# Patient Record
Sex: Female | Born: 1955
Health system: Southern US, Community
[De-identification: ages and names within clinical notes are randomized; demographics above are authoritative.]

## PROBLEM LIST (undated history)

## (undated) DIAGNOSIS — E46 Unspecified protein-calorie malnutrition: Secondary | ICD-10-CM

## (undated) DIAGNOSIS — I10 Essential (primary) hypertension: Secondary | ICD-10-CM

## (undated) DIAGNOSIS — K5792 Diverticulitis of intestine, part unspecified, without perforation or abscess without bleeding: Secondary | ICD-10-CM

## (undated) DIAGNOSIS — J90 Pleural effusion, not elsewhere classified: Secondary | ICD-10-CM

## (undated) DIAGNOSIS — M25519 Pain in unspecified shoulder: Secondary | ICD-10-CM

## (undated) DIAGNOSIS — M810 Age-related osteoporosis without current pathological fracture: Secondary | ICD-10-CM

## (undated) DIAGNOSIS — G43009 Migraine without aura, not intractable, without status migrainosus: Secondary | ICD-10-CM

## (undated) DIAGNOSIS — N3010 Interstitial cystitis (chronic) without hematuria: Secondary | ICD-10-CM

## (undated) DIAGNOSIS — I471 Supraventricular tachycardia, unspecified: Secondary | ICD-10-CM

## (undated) DIAGNOSIS — Z973 Presence of spectacles and contact lenses: Secondary | ICD-10-CM

## (undated) DIAGNOSIS — D682 Hereditary deficiency of other clotting factors: Secondary | ICD-10-CM

## (undated) DIAGNOSIS — R0602 Shortness of breath: Secondary | ICD-10-CM

## (undated) DIAGNOSIS — R945 Abnormal results of liver function studies: Secondary | ICD-10-CM

## (undated) DIAGNOSIS — K219 Gastro-esophageal reflux disease without esophagitis: Secondary | ICD-10-CM

## (undated) DIAGNOSIS — D72829 Elevated white blood cell count, unspecified: Secondary | ICD-10-CM

## (undated) DIAGNOSIS — H538 Other visual disturbances: Secondary | ICD-10-CM

## (undated) DIAGNOSIS — J309 Allergic rhinitis, unspecified: Secondary | ICD-10-CM

## (undated) DIAGNOSIS — D219 Benign neoplasm of connective and other soft tissue, unspecified: Secondary | ICD-10-CM

## (undated) DIAGNOSIS — F32A Depression, unspecified: Secondary | ICD-10-CM

## (undated) DIAGNOSIS — E041 Nontoxic single thyroid nodule: Secondary | ICD-10-CM

## (undated) DIAGNOSIS — G43909 Migraine, unspecified, not intractable, without status migrainosus: Secondary | ICD-10-CM

## (undated) DIAGNOSIS — E785 Hyperlipidemia, unspecified: Secondary | ICD-10-CM

## (undated) DIAGNOSIS — E559 Vitamin D deficiency, unspecified: Secondary | ICD-10-CM

## (undated) DIAGNOSIS — N920 Excessive and frequent menstruation with regular cycle: Secondary | ICD-10-CM

## (undated) DIAGNOSIS — G459 Transient cerebral ischemic attack, unspecified: Secondary | ICD-10-CM

## (undated) DIAGNOSIS — R079 Chest pain, unspecified: Secondary | ICD-10-CM

## (undated) DIAGNOSIS — K589 Irritable bowel syndrome without diarrhea: Secondary | ICD-10-CM

## (undated) DIAGNOSIS — N3090 Cystitis, unspecified without hematuria: Secondary | ICD-10-CM

## (undated) HISTORY — DX: Chest pain, unspecified: R07.9

## (undated) HISTORY — PX: BREAST EXCISIONAL BIOPSY: SUR124

## (undated) HISTORY — DX: Abnormal results of liver function studies: R94.5

## (undated) HISTORY — DX: Hereditary deficiency of other clotting factors: D68.2

## (undated) HISTORY — DX: Age-related osteoporosis without current pathological fracture: M81.0

## (undated) HISTORY — DX: Gastro-esophageal reflux disease without esophagitis: K21.9

## (undated) HISTORY — DX: Transient cerebral ischemic attack, unspecified: G45.9

## (undated) HISTORY — PX: CHOLECYSTECTOMY: SHX55

## (undated) HISTORY — DX: Elevated white blood cell count, unspecified: D72.829

## (undated) HISTORY — DX: Nontoxic single thyroid nodule: E04.1

## (undated) HISTORY — DX: Allergic rhinitis, unspecified: J30.9

## (undated) HISTORY — PX: BREAST SURGERY: SHX581

## (undated) HISTORY — DX: Shortness of breath: R06.02

## (undated) HISTORY — DX: Diverticulitis of intestine, part unspecified, without perforation or abscess without bleeding: K57.92

## (undated) HISTORY — DX: Pleural effusion, not elsewhere classified: J90

## (undated) HISTORY — DX: Irritable bowel syndrome, unspecified: K58.9

## (undated) HISTORY — DX: Unspecified protein-calorie malnutrition: E46

## (undated) HISTORY — DX: Hyperlipidemia, unspecified: E78.5

## (undated) HISTORY — DX: Vitamin D deficiency, unspecified: E55.9

## (undated) HISTORY — DX: Excessive and frequent menstruation with regular cycle: N92.0

## (undated) HISTORY — DX: Migraine without aura, not intractable, without status migrainosus: G43.009

## (undated) HISTORY — DX: Benign neoplasm of connective and other soft tissue, unspecified: D21.9

## (undated) HISTORY — DX: Cystitis, unspecified without hematuria: N30.90

## (undated) HISTORY — DX: Pain in unspecified shoulder: M25.519

## (undated) HISTORY — PX: APPENDECTOMY: SHX54

## (undated) HISTORY — DX: Other visual disturbances: H53.8

## (undated) HISTORY — DX: Migraine, unspecified, not intractable, without status migrainosus: G43.909

## (undated) HISTORY — PX: TONSILLECTOMY: SUR1361

## (undated) HISTORY — DX: Supraventricular tachycardia, unspecified: I47.10

## (undated) HISTORY — DX: Interstitial cystitis (chronic) without hematuria: N30.10

## (undated) HISTORY — DX: Depression, unspecified: F32.A

---

## 2003-07-23 HISTORY — PX: VAGINAL HYSTERECTOMY: SUR661

## 2012-03-30 ENCOUNTER — Other Ambulatory Visit: Payer: Self-pay | Admitting: Obstetrics and Gynecology

## 2012-03-30 DIAGNOSIS — Z1231 Encounter for screening mammogram for malignant neoplasm of breast: Secondary | ICD-10-CM

## 2012-04-07 ENCOUNTER — Ambulatory Visit
Admission: RE | Admit: 2012-04-07 | Discharge: 2012-04-07 | Disposition: A | Discharge status: Home / Self Care | Payer: BC Managed Care – PPO | Source: Ambulatory Visit | Attending: Obstetrics and Gynecology | Admitting: Obstetrics and Gynecology

## 2012-04-07 DIAGNOSIS — Z1231 Encounter for screening mammogram for malignant neoplasm of breast: Secondary | ICD-10-CM

## 2013-05-10 ENCOUNTER — Other Ambulatory Visit: Payer: Self-pay | Admitting: Certified Nurse Midwife

## 2013-05-10 NOTE — Telephone Encounter (Signed)
Rx given for #30 w/ 0refills Estradiol 1mg   Annual Exam 06/01/13 D.Darcel Bayley

## 2013-05-31 ENCOUNTER — Encounter: Payer: Self-pay | Admitting: Certified Nurse Midwife

## 2013-06-01 ENCOUNTER — Ambulatory Visit (INDEPENDENT_AMBULATORY_CARE_PROVIDER_SITE_OTHER): Payer: Managed Care, Other (non HMO) | Admitting: Certified Nurse Midwife

## 2013-06-01 ENCOUNTER — Encounter: Payer: Self-pay | Admitting: Certified Nurse Midwife

## 2013-06-01 VITALS — BP 110/64 | HR 68 | Resp 16 | Ht 63.75 in | Wt 113.0 lb

## 2013-06-01 DIAGNOSIS — Z01419 Encounter for gynecological examination (general) (routine) without abnormal findings: Secondary | ICD-10-CM

## 2013-06-01 DIAGNOSIS — Z0000 Encounter for general adult medical examination without abnormal findings: Secondary | ICD-10-CM

## 2013-06-01 DIAGNOSIS — N951 Menopausal and female climacteric states: Secondary | ICD-10-CM

## 2013-06-01 DIAGNOSIS — Z8669 Personal history of other diseases of the nervous system and sense organs: Secondary | ICD-10-CM

## 2013-06-01 MED ORDER — ESTRADIOL 1 MG PO TABS: ORAL_TABLET | ORAL | Status: DC

## 2013-06-01 MED ORDER — SUMATRIPTAN SUCCINATE 50 MG PO TABS: 50.0000 mg | ORAL_TABLET | ORAL | Status: DC | PRN

## 2013-06-01 NOTE — Patient Instructions (Signed)

## 2013-06-01 NOTE — Progress Notes (Signed)
57 y.o. [id number] Married Caucasian Fe here for annual exam. Menopausal on ERT, denies vaginal bleeding or dryness. No health issues today. Sees PCP once yearly for aex and labs. Social stress with spouse being diagnosed with polymyositis. Spouse on prednisone now and not coping well with medication. Migraines with aura still occuring but with less frequency.  Patient's last menstrual period was 07/23/2003.          Sexually active: yes  The current method of family planning is status post hysterectomy.    Exercising: no  exercise Smoker:  no  Health Maintenance: Pap:  6/11 neg MMG:  10/13 will schedule  Colonoscopy:  2005 10 years BMD:   2013 improved with Reclast use PCP  TDaP:  With in 57yrs Labs: none Self breast exam: done occ   reports that she has never smoked. She does not have any smokeless tobacco history on file. She reports that she does not drink alcohol or use illicit drugs.  Past Medical History  Diagnosis Date  . Migraines   . Osteoporosis     on reclast  . Thyroid nodule     u/s 2011 neg  . IBS (irritable bowel syndrome)   . Fibroid   . Menorrhagia     Past Surgical History  Procedure Laterality Date  . Vaginal hysterectomy  2005  . Breast surgery  [id number]    fibroadenoma in rt & left breast  . Cholecystectomy    . Tonsillectomy      Current Outpatient Prescriptions  Medication Sig Dispense Refill  . Acetaminophen (TYLENOL PO) Take by mouth as needed.      . Calcium Carbonate-Vitamin D (CALCIUM + D PO) Take by mouth daily.      Marland Kitchen doxylamine, Sleep, (UNISOM) 25 MG tablet Take 25 mg by mouth at bedtime as needed.      Marland Kitchen estradiol (ESTRACE) 1 MG tablet TAKE ONE TABLET BY MOUTH EVERY DAY  90 tablet  4  . fluticasone (FLONASE) 50 MCG/ACT nasal spray Place into both nostrils as needed for allergies or rhinitis.      Marland Kitchen loratadine (CLARITIN) 10 MG tablet Take 10 mg by mouth as needed.       . Multiple Vitamins-Minerals (MULTIVITAMIN PO) Take by mouth daily.       . Probiotic Product (PROBIOTIC PO) Take by mouth daily.      . SUMAtriptan (IMITREX) 50 MG tablet Take 1 tablet (50 mg total) by mouth every 2 (two) hours as needed for migraine or headache. One at onset, repeat once 2 hours prn  10 tablet  2   No current facility-administered medications for this visit.    Family History  Problem Relation Age of Onset  . Hypertension Mother   . Osteoporosis Mother   . Stroke Mother   . Thyroid disease Mother   . Cancer Father     bladder  . Hypertension Father   . Stroke Father   . Hypertension Sister   . Hypertension Brother   . Diabetes Maternal Grandmother     ROS:  Pertinent items are noted in HPI.  Otherwise, a comprehensive ROS was negative.  Exam:   BP 110/64  Pulse 68  Resp 16  Ht 5' 3.75" (1.619 m)  Wt 113 lb (51.256 kg)  BMI 19.55 kg/m2  LMP 07/23/2003 Height: 5' 3.75" (161.9 cm)  Ht Readings from Last 3 Encounters:  06/01/13 5' 3.75" (1.619 m)    General appearance: alert, cooperative and appears stated age Head: Normocephalic,  without obvious abnormality, atraumatic Neck: no adenopathy, supple, symmetrical, trachea midline and thyroid normal to inspection and palpation Lungs: clear to auscultation bilaterally Breasts: normal appearance, no masses or tenderness, No nipple retraction or dimpling, No nipple discharge or bleeding, No axillary or supraclavicular adenopathy Heart: regular rate and rhythm Abdomen: soft, non-tender; no masses,  no organomegaly Extremities: extremities normal, atraumatic, no cyanosis or edema Skin: Skin color, texture, turgor normal. No rashes or lesions Lymph nodes: Cervical, supraclavicular, and axillary nodes normal. No abnormal inguinal nodes palpated Neurologic: Grossly normal   Pelvic: External genitalia:  no lesions              Urethra:  normal appearing urethra with no masses, tenderness or lesions              Bartholin's and Skene's: normal                 Vagina: normal appearing  vagina with normal color and discharge, no lesions              Cervix: absent              Pap taken: no Bimanual Exam:  Uterus:  uterus absent              Adnexa: normal adnexa and no mass, fullness, tenderness               Rectovaginal: Confirms               Anus:  normal sphincter tone, no lesions  A:  Well Woman with normal exam  Menopausal on ERT S/P TVH due to fibroids with ovaries retained  Migraine headache history with aura Imitrex works well for treatment, needs RX  Social stress with spouse illness    P:   Reviewed health and wellness pertinent to exam  Discussed risks and benefits desires continuance  Rx Estrace see order  Rx Imitrex see order aware of warning signs with headache  Encouraged time for self and friends,family and church support.  Pap smear as per guidelines   Mammogram yearly pap smear not taken today IFOB dispensed, colonoscopy due in 2015  counseled on breast self exam, mammography screening, use and side effects of HRT, osteoporosis, adequate intake of calcium and vitamin D, diet and exercise  return annually or prn  An After Visit Summary was printed and given to the patient.

## 2013-06-02 ENCOUNTER — Telehealth: Payer: Self-pay | Admitting: *Deleted

## 2013-06-02 DIAGNOSIS — Z8669 Personal history of other diseases of the nervous system and sense organs: Secondary | ICD-10-CM

## 2013-06-02 NOTE — Telephone Encounter (Signed)
Fax From: [facility] Delivery for Sumatriptan 50 mg Last Refilled: AEX 06/01/13 #10/2 refills  Called [employer] s/w pharmacist Thurston Hole they hadn't received the Estradiol or Imitrex rx.  Called Estradiol 1 mg #90/4 refills Imitrex 50 mg #9/2 refills (they could only do 9 at a time or 18 at a time)  Sent to Provider for review

## 2013-06-03 MED ORDER — SUMATRIPTAN SUCCINATE 50 MG PO TABS: 50.0000 mg | ORAL_TABLET | ORAL | Status: DC | PRN

## 2013-06-03 NOTE — Telephone Encounter (Signed)
Ok to do 18 of Imitrex

## 2013-06-03 NOTE — Progress Notes (Signed)
Note reviewed, agree with plan.  Roel Douthat, MD  

## 2013-06-03 NOTE — Telephone Encounter (Signed)
Imitrex 50 mg #18/2 refills sent to [facility].

## 2013-06-03 NOTE — Addendum Note (Signed)
Addended by: Lorraine Lax on: 06/03/2013 02:55 PM   Modules accepted: Orders

## 2013-11-23 ENCOUNTER — Other Ambulatory Visit: Payer: Self-pay

## 2013-11-23 DIAGNOSIS — Z1231 Encounter for screening mammogram for malignant neoplasm of breast: Secondary | ICD-10-CM

## 2013-12-06 ENCOUNTER — Ambulatory Visit
Admission: RE | Admit: 2013-12-06 | Discharge: 2013-12-06 | Disposition: A | Discharge status: Home / Self Care | Payer: Managed Care, Other (non HMO) | Source: Ambulatory Visit

## 2013-12-06 ENCOUNTER — Encounter (INDEPENDENT_AMBULATORY_CARE_PROVIDER_SITE_OTHER): Payer: Self-pay

## 2013-12-06 DIAGNOSIS — Z1231 Encounter for screening mammogram for malignant neoplasm of breast: Secondary | ICD-10-CM

## 2014-05-23 ENCOUNTER — Encounter: Payer: Self-pay | Admitting: Certified Nurse Midwife

## 2014-06-06 ENCOUNTER — Ambulatory Visit: Payer: Managed Care, Other (non HMO) | Admitting: Nurse Practitioner

## 2014-06-06 ENCOUNTER — Encounter: Payer: Self-pay | Admitting: Certified Nurse Midwife

## 2014-06-06 ENCOUNTER — Ambulatory Visit (INDEPENDENT_AMBULATORY_CARE_PROVIDER_SITE_OTHER): Payer: Managed Care, Other (non HMO) | Admitting: Certified Nurse Midwife

## 2014-06-06 VITALS — BP 110/70 | HR 80 | Resp 16 | Ht 64.0 in | Wt 120.0 lb

## 2014-06-06 DIAGNOSIS — Z0000 Encounter for general adult medical examination without abnormal findings: Secondary | ICD-10-CM

## 2014-06-06 DIAGNOSIS — Z01419 Encounter for gynecological examination (general) (routine) without abnormal findings: Secondary | ICD-10-CM

## 2014-06-06 DIAGNOSIS — N951 Menopausal and female climacteric states: Secondary | ICD-10-CM

## 2014-06-06 DIAGNOSIS — Z1211 Encounter for screening for malignant neoplasm of colon: Secondary | ICD-10-CM

## 2014-06-06 LAB — CBC
HEMATOCRIT: 40.9 % (ref 36.0–46.0)
Hemoglobin: 14 g/dL (ref 12.0–15.0)
MCH: 30.5 pg (ref 26.0–34.0)
MCHC: 34.2 g/dL (ref 30.0–36.0)
MCV: 89.1 fL (ref 78.0–100.0)
MPV: 10.7 fL (ref 9.4–12.4)
Platelets: 338 10*3/uL (ref 150–400)
RBC: 4.59 MIL/uL (ref 3.87–5.11)
RDW: 14.1 % (ref 11.5–15.5)
WBC: 7.3 10*3/uL (ref 4.0–10.5)

## 2014-06-06 LAB — COMPREHENSIVE METABOLIC PANEL
ALK PHOS: 67 U/L (ref 39–117)
ALT: 17 U/L (ref 0–35)
AST: 20 U/L (ref 0–37)
Albumin: 4.5 g/dL (ref 3.5–5.2)
BILIRUBIN TOTAL: 0.5 mg/dL (ref 0.2–1.2)
BUN: 13 mg/dL (ref 6–23)
CO2: 26 meq/L (ref 19–32)
CREATININE: 0.61 mg/dL (ref 0.50–1.10)
Calcium: 9.9 mg/dL (ref 8.4–10.5)
Chloride: 100 mEq/L (ref 96–112)
GLUCOSE: 89 mg/dL (ref 70–99)
POTASSIUM: 4.2 meq/L (ref 3.5–5.3)
SODIUM: 139 meq/L (ref 135–145)
Total Protein: 7.4 g/dL (ref 6.0–8.3)

## 2014-06-06 LAB — LIPID PANEL
CHOLESTEROL: 211 mg/dL — AB (ref 0–200)
HDL: 112 mg/dL (ref >39)
LDL Cholesterol: 76 mg/dL (ref 0–99)
Total CHOL/HDL Ratio: 1.9 Ratio
Triglycerides: 117 mg/dL (ref <150)
VLDL: 23 mg/dL (ref 0–40)

## 2014-06-06 LAB — TSH: TSH: 2.166 u[IU]/mL (ref 0.350–4.500)

## 2014-06-06 MED ORDER — ESTRADIOL 1 MG PO TABS: ORAL_TABLET | ORAL | Status: DC

## 2014-06-06 NOTE — Progress Notes (Signed)
58 y.o. [id number] Married Caucasian Fe here for annual exam.  Menopausal on ERT working well. No hot flashes noted.Sees PCP for prn/aex. Not able to schedule with PCP until 11/16. Patient would like to do screening labs here and is ready to schedule colonoscopy again. Spouse diagnosed with ALS and is trying to cope with the "diagnosis and the thought of losing her spouse". Has friends, family and church support and is trying to support him now having to stop working. No other health issues today.  Patient's last menstrual period was 07/23/2003.          Sexually active: Yes.    The current method of family planning is status post hysterectomy.    Exercising: No.  The patient does not participate in regular exercise at present. Smoker:  no  Health Maintenance: Pap:  12/2009 Neg. HR HPV: Neg MMG:  11/2013 BIRADS2: benign  Category C Colonoscopy:  2005. Pt due BMD:   2013  TDaP:  Pt unsure  Feels she is up to date Labs: PCP   reports that she has never smoked. She has never used smokeless tobacco. She reports that she does not drink alcohol or use illicit drugs.  Past Medical History  Diagnosis Date  . Migraines   . Osteoporosis     on reclast  . Thyroid nodule     u/s 2011 neg  . IBS (irritable bowel syndrome)   . Fibroid   . Menorrhagia     Past Surgical History  Procedure Laterality Date  . Vaginal hysterectomy  2005  . Breast surgery  [id number]    fibroadenoma in rt & left breast  . Cholecystectomy    . Tonsillectomy      Current Outpatient Prescriptions  Medication Sig Dispense Refill  . Acetaminophen (TYLENOL PO) Take by mouth as needed.    . Calcium Carbonate-Vitamin D (CALCIUM + D PO) Take by mouth daily.    Marland Kitchen doxylamine, Sleep, (UNISOM) 25 MG tablet Take 25 mg by mouth at bedtime as needed.    Marland Kitchen estradiol (ESTRACE) 1 MG tablet TAKE ONE TABLET BY MOUTH EVERY DAY 90 tablet 4  . fluticasone (FLONASE) 50 MCG/ACT nasal spray Place into both nostrils as needed for allergies  or rhinitis.    Marland Kitchen loratadine (CLARITIN) 10 MG tablet Take 10 mg by mouth as needed.     . Multiple Vitamins-Minerals (MULTIVITAMIN PO) Take by mouth daily.    . Probiotic Product (PROBIOTIC PO) Take by mouth daily.    . SUMAtriptan (IMITREX) 50 MG tablet Take 1 tablet (50 mg total) by mouth every 2 (two) hours as needed for migraine or headache. One at onset, repeat once 2 hours prn 18 tablet 2   No current facility-administered medications for this visit.    Family History  Problem Relation Age of Onset  . Hypertension Mother   . Osteoporosis Mother   . Stroke Mother   . Thyroid disease Mother   . Cancer Father     bladder  . Hypertension Father   . Stroke Father   . Hypertension Sister   . Hypertension Brother   . Diabetes Maternal Grandmother     ROS:  Pertinent items are noted in HPI.  Otherwise, a comprehensive ROS was negative.  Exam:   BP 110/70 mmHg  Pulse 80  Resp 16  Ht 5\' 4"  (1.626 m)  Wt 120 lb (54.432 kg)  BMI 20.59 kg/m2  LMP 07/23/2003 Height: 5\' 4"  (162.6 cm)  Ht Readings from Last  3 Encounters:  06/06/14 5\' 4"  (1.626 m)  06/01/13 5' 3.75" (1.619 m)    General appearance: alert, cooperative and appears stated age Head: Normocephalic, without obvious abnormality, atraumatic Neck: no adenopathy, supple, symmetrical, trachea midline and thyroid normal to inspection and palpation Lungs: clear to auscultation bilaterally Breasts: normal appearance, no masses or tenderness, No nipple retraction or dimpling, No nipple discharge or bleeding, No axillary or supraclavicular adenopathy Heart: regular rate and rhythm Abdomen: soft, non-tender; no masses,  no organomegaly Extremities: extremities normal, atraumatic, no cyanosis or edema Skin: Skin color, texture, turgor normal. No rashes or lesions Lymph nodes: Cervical, supraclavicular, and axillary nodes normal. No abnormal inguinal nodes palpated Neurologic: Grossly normal   Pelvic: External genitalia:  no  lesions              Urethra:  normal appearing urethra with no masses, tenderness or lesions              Bartholin's and Skene's: normal                 Vagina: normal appearing vagina with normal color and discharge, no lesions              Cervix: absent              Pap taken: No. Bimanual Exam:  Uterus:  uterus absent              Adnexa: normal adnexa               Rectovaginal: Confirms               Anus:  normal sphincter tone, no lesions  A:  Well Woman with normal exam  Menopausal on ERT working well s/p TVH ovaries retained fibroids  Osteoporosis previous Relcast use on off year  BMD due  Colonoscopy due  Screening labs  Social stress with spouse illness  P:   Reviewed health and wellness pertinent to exam  Discussed risk and benefits of ERT continuation, Questions addressed. Provider and patient agree continuing is appropriate with history of Osteoporosis and menopausal symptoms continuing. Will advise if any health change.  Patient will schedule BMD  Referral made for colonoscopy  Lab: CBC,CMP,Lipid panel, TSH,Vit. D  Discussed time for self and continued support as needed from support network.  Pap smear not taken today  Reviewed nutritional needs and encouraged regular exercise and good diet. SBE monthly and mammogram yearly.  return annually or prn  An After Visit Summary was printed and given to the patient.

## 2014-06-06 NOTE — Patient Instructions (Signed)
EXERCISE AND DIET:  We recommended that you start or continue a regular exercise program for good health. Regular exercise means any activity that makes your heart beat faster and makes you sweat.  We recommend exercising at least 30 minutes per day at least 3 days a week, preferably 4 or 5.  We also recommend a diet low in fat and sugar.  Inactivity, poor dietary choices and obesity can cause diabetes, heart attack, stroke, and kidney damage, among others.    ALCOHOL AND SMOKING:  Women should limit their alcohol intake to no more than 7 drinks/beers/glasses of wine (combined, not each!) per week. Moderation of alcohol intake to this level decreases your risk of breast cancer and liver damage. And of course, no recreational drugs are part of a healthy lifestyle.  And absolutely no smoking or even second hand smoke. Most people know smoking can cause heart and lung diseases, but did you know it also contributes to weakening of your bones? Aging of your skin?  Yellowing of your teeth and nails?  CALCIUM AND VITAMIN D:  Adequate intake of calcium and Vitamin D are recommended.  The recommendations for exact amounts of these supplements seem to change often, but generally speaking 600 mg of calcium (either carbonate or citrate) and 800 units of Vitamin D per day seems prudent. Certain women may benefit from higher intake of Vitamin D.  If you are among these women, your doctor will have told you during your visit.    PAP SMEARS:  Pap smears, to check for cervical cancer or precancers,  have traditionally been done yearly, although recent scientific advances have shown that most women can have pap smears less often.  However, every woman still should have a physical exam from her gynecologist every year. It will include a breast check, inspection of the vulva and vagina to check for abnormal growths or skin changes, a visual exam of the cervix, and then an exam to evaluate the size and shape of the uterus and  ovaries.  And after 56 years of age, a rectal exam is indicated to check for rectal cancers. We will also provide age appropriate advice regarding health maintenance, like when you should have certain vaccines, screening for sexually transmitted diseases, bone density testing, colonoscopy, mammograms, etc.   MAMMOGRAMS:  All women over 40 years old should have a yearly mammogram. Many facilities now offer a "3D" mammogram, which may cost around $50 extra out of pocket. If possible,  we recommend you accept the option to have the 3D mammogram performed.  It both reduces the number of women who will be called back for extra views which then turn out to be normal, and it is better than the routine mammogram at detecting truly abnormal areas.    COLONOSCOPY:  Colonoscopy to screen for colon cancer is recommended for all women at age 50.  We know, you hate the idea of the prep.  We agree, BUT, having colon cancer and not knowing it is worse!!  Colon cancer so often starts as a polyp that can be seen and removed at colonscopy, which can quite literally save your life!  And if your first colonoscopy is normal and you have no family history of colon cancer, most women don't have to have it again for 10 years.  Once every ten years, you can do something that may end up saving your life, right?  We will be happy to help you get it scheduled when you are ready.    Be sure to check your insurance coverage so you understand how much it will cost.  It may be covered as a preventative service at no cost, but you should check your particular policy.     Osteoporosis Throughout your life, your body breaks down old bone and replaces it with new bone. As you get older, your body does not replace bone as quickly as it breaks it down. By the age of 30 years, most people begin to gradually lose bone because of the imbalance between bone loss and replacement. Some people lose more bone than others. Bone loss beyond a specified  normal degree is considered osteoporosis.  Osteoporosis affects the strength and durability of your bones. The inside of the ends of your bones and your flat bones, like the bones of your pelvis, look like honeycomb, filled with tiny open spaces. As bone loss occurs, your bones become less dense. This means that the open spaces inside your bones become bigger and the walls between these spaces become thinner. This makes your bones weaker. Bones of a person with osteoporosis can become so weak that they can break (fracture) during minor accidents, such as a simple fall. CAUSES  The following factors have been associated with the development of osteoporosis:  Smoking.  Drinking more than 2 alcoholic drinks several days per week.  Long-term use of certain medicines:  Corticosteroids.  Chemotherapy medicines.  Thyroid medicines.  Antiepileptic medicines.  Gonadal hormone suppression medicine.  Immunosuppression medicine.  Being underweight.  Lack of physical activity.  Lack of exposure to the sun. This can lead to vitamin D deficiency.  Certain medical conditions:  Certain inflammatory bowel diseases, such as Crohn disease and ulcerative colitis.  Diabetes.  Hyperthyroidism.  Hyperparathyroidism. RISK FACTORS Anyone can develop osteoporosis. However, the following factors can increase your risk of developing osteoporosis:  Gender--Women are at higher risk than men.  Age--Being older than 50 years increases your risk.  Ethnicity--White and Asian people have an increased risk.  Weight --Being extremely underweight can increase your risk of osteoporosis.  Family history of osteoporosis--Having a family member who has developed osteoporosis can increase your risk. SYMPTOMS  Usually, people with osteoporosis have no symptoms.  DIAGNOSIS  Signs during a physical exam that may prompt your caregiver to suspect osteoporosis include:  Decreased height. This is usually caused  by the compression of the bones that form your spine (vertebrae) because they have weakened and become fractured.  A curving or rounding of the upper back (kyphosis). To confirm signs of osteoporosis, your caregiver may request a procedure that uses 2 low-dose X-ray beams with different levels of energy to measure your bone mineral density (dual-energy X-ray absorptiometry [DXA]). Also, your caregiver may check your level of vitamin D. TREATMENT  The goal of osteoporosis treatment is to strengthen bones in order to decrease the risk of bone fractures. There are different types of medicines available to help achieve this goal. Some of these medicines work by slowing the processes of bone loss. Some medicines work by increasing bone density. Treatment also involves making sure that your levels of calcium and vitamin D are adequate. PREVENTION  There are things you can do to help prevent osteoporosis. Adequate intake of calcium and vitamin D can help you achieve optimal bone mineral density. Regular exercise can also help, especially resistance and weight-bearing activities. If you smoke, quitting smoking is an important part of osteoporosis prevention. MAKE SURE YOU:  Understand these instructions.  Will watch your condition.    Will get help right away if you are not doing well or get worse. FOR MORE INFORMATION www.osteo.org and www.nof.org Document Released: 04/17/2005 Document Revised: 11/02/2012 Document Reviewed: 06/22/2011 [employer] Patient Information 2015 [employer]. This information is not intended to replace advice given to you by your health care provider. Make sure you discuss any questions you have with your health care provider.  

## 2014-06-07 ENCOUNTER — Telehealth: Payer: Self-pay

## 2014-06-07 LAB — VITAMIN D 25 HYDROXY (VIT D DEFICIENCY, FRACTURES): Vit D, 25-Hydroxy: 32 ng/mL (ref 30–100)

## 2014-06-07 NOTE — Telephone Encounter (Signed)
-----   Message from Regina Eck, CNM sent at 06/07/2014  6:23 AM EST ----- Notify patient that her Vitamin D is borderline low at 32, normal 30 Start on OTC Vit. D 3 1000 IU daily TSH, CBC, and Liver kidney, glucose panel normal Lipid panel  Shows borderline high cholesterol, but HDL, protective Cholesterol is great at 112, no concerns Referral made to GI, you will be called with appointment

## 2014-06-07 NOTE — Progress Notes (Signed)
Reviewed personally.  M. Suzanne Aimar Borghi, MD.  

## 2014-06-07 NOTE — Telephone Encounter (Signed)
lmtcb

## 2014-06-07 NOTE — Telephone Encounter (Signed)
Patient notified see result note 

## 2014-06-08 ENCOUNTER — Other Ambulatory Visit: Payer: Self-pay | Admitting: Certified Nurse Midwife

## 2014-06-08 NOTE — Telephone Encounter (Signed)
Last refilled: 06/03/13 #18/2 rfs Last AEX: 06/06/14 with Ms. Debbie no rx was sent/given AEX Scheduled: 06/20/15 with Ms.Debbie   Please Advise.

## 2014-08-30 ENCOUNTER — Ambulatory Visit (HOSPITAL_COMMUNITY)
Admission: RE | Admit: 2014-08-30 | Discharge: 2014-08-30 | Disposition: A | Discharge status: Home / Self Care | Payer: Managed Care, Other (non HMO) | Source: Ambulatory Visit | Attending: Internal Medicine | Admitting: Internal Medicine

## 2014-08-30 ENCOUNTER — Encounter (HOSPITAL_COMMUNITY): Payer: Self-pay

## 2014-08-30 DIAGNOSIS — M810 Age-related osteoporosis without current pathological fracture: Secondary | ICD-10-CM | POA: Diagnosis not present

## 2014-08-30 MED ORDER — ZOLEDRONIC ACID 5 MG/100ML IV SOLN
5.0000 mg | Freq: Once | INTRAVENOUS | Status: AC
  Administered 2014-08-30: 5 mg via INTRAVENOUS
  Filled 2014-08-30: qty 100

## 2014-08-30 MED ORDER — SODIUM CHLORIDE 0.9 % IV SOLN
250.0000 mL | INTRAVENOUS | Status: DC
  Administered 2014-08-30: 250 mL via INTRAVENOUS

## 2014-09-06 ENCOUNTER — Encounter (HOSPITAL_COMMUNITY): Payer: Managed Care, Other (non HMO)

## 2014-11-08 ENCOUNTER — Other Ambulatory Visit: Payer: Self-pay

## 2014-11-08 DIAGNOSIS — Z1231 Encounter for screening mammogram for malignant neoplasm of breast: Secondary | ICD-10-CM

## 2014-12-12 ENCOUNTER — Ambulatory Visit: Payer: Managed Care, Other (non HMO)

## 2014-12-12 ENCOUNTER — Ambulatory Visit
Admission: RE | Admit: 2014-12-12 | Discharge: 2014-12-12 | Disposition: A | Discharge status: Home / Self Care | Payer: Managed Care, Other (non HMO) | Source: Ambulatory Visit

## 2014-12-12 DIAGNOSIS — Z1231 Encounter for screening mammogram for malignant neoplasm of breast: Secondary | ICD-10-CM

## 2015-06-09 ENCOUNTER — Other Ambulatory Visit: Payer: Self-pay | Admitting: Certified Nurse Midwife

## 2015-06-09 DIAGNOSIS — N951 Menopausal and female climacteric states: Secondary | ICD-10-CM

## 2015-06-09 MED ORDER — ESTRADIOL 1 MG PO TABS: ORAL_TABLET | ORAL | Status: DC

## 2015-06-09 NOTE — Telephone Encounter (Signed)
Patient requesting refill of Estradiol 1 mg sent to [facility] on [address] to last her until AEX 06/20/2015 with Ms. Debbie Mammogram: 12/12/2014 bi-rads 2: benign Best # to reach: [phone]

## 2015-06-09 NOTE — Telephone Encounter (Signed)
Medication refill request: Estradiol 1 mg  Last AEX:  06/06/14 DL Next AEX: 06/20/15 DL Last MMG (if hormonal medication request): 12/12/14 BIRADS2:benign  Refill authorized: 06/06/14 #90tabs/4R. Today #30/0R?

## 2015-06-19 ENCOUNTER — Telehealth: Payer: Self-pay | Admitting: Certified Nurse Midwife

## 2015-06-19 NOTE — Telephone Encounter (Signed)
Left message on voicemail to call and reschedule cancelled appointment. °

## 2015-06-20 ENCOUNTER — Ambulatory Visit: Payer: Managed Care, Other (non HMO) | Admitting: Certified Nurse Midwife

## 2015-07-10 ENCOUNTER — Other Ambulatory Visit: Payer: Self-pay | Admitting: Certified Nurse Midwife

## 2015-07-11 NOTE — Telephone Encounter (Signed)
Medication refill request: Estradiol 1mg  tablet Last AEX: 06/06/14 DL Next AEX: 08/01/15 DL Last MMG (if hormonal medication request): 12/12/14 BIRADS Category 2 Benign Refill authorized: 06/09/2015 Estradiol #30 tabs 0 Refills  Today: #30 Tabs 0 Refills?

## 2015-08-01 ENCOUNTER — Encounter: Payer: Self-pay | Admitting: Certified Nurse Midwife

## 2015-08-01 ENCOUNTER — Ambulatory Visit (INDEPENDENT_AMBULATORY_CARE_PROVIDER_SITE_OTHER): Payer: BLUE CROSS/BLUE SHIELD | Admitting: Certified Nurse Midwife

## 2015-08-01 ENCOUNTER — Ambulatory Visit: Payer: Managed Care, Other (non HMO) | Admitting: Certified Nurse Midwife

## 2015-08-01 VITALS — BP 108/62 | HR 68 | Resp 16 | Ht 63.5 in | Wt 127.0 lb

## 2015-08-01 DIAGNOSIS — Z8669 Personal history of other diseases of the nervous system and sense organs: Secondary | ICD-10-CM | POA: Diagnosis not present

## 2015-08-01 DIAGNOSIS — N951 Menopausal and female climacteric states: Secondary | ICD-10-CM | POA: Diagnosis not present

## 2015-08-01 DIAGNOSIS — Z01419 Encounter for gynecological examination (general) (routine) without abnormal findings: Secondary | ICD-10-CM | POA: Diagnosis not present

## 2015-08-01 MED ORDER — SUMATRIPTAN SUCCINATE 50 MG PO TABS: 50.0000 mg | ORAL_TABLET | Freq: Once | ORAL | Status: AC

## 2015-08-01 MED ORDER — ESTRADIOL 1 MG PO TABS: 1.0000 mg | ORAL_TABLET | Freq: Every day | ORAL | Status: DC

## 2015-08-01 NOTE — Patient Instructions (Signed)

## 2015-08-01 NOTE — Progress Notes (Signed)
Reviewed personally.  M. Suzanne Maahir Horst, MD.  

## 2015-08-01 NOTE — Progress Notes (Signed)
60 y.o. [id number] Married  Caucasian Fe here for annual exam. Menopausal no HRT. Denies vaginal bleeding or vaginal dryness. Spouse has ALS and now has electric wheelchair. Emotionally doing OK with family, friends and church support to help with spouse.Recent visit with Dr. Joylene Draft for aex and labs, all normal. On break from Reclast for osteoporosis. PCP manages. Recent history of Factor V Leiden in family with brother who is on medication and sister found to be positive for. Plans to see Dr. Joylene Draft for evaluation with labs and will do HIV and Hep. C at that time. Patient will advise of results. Some insomnia with stopping Unisom, but improving. No other health concerns today.  Patient's last menstrual period was 07/23/2003.          Sexually active: Yes.    The current method of family planning is status post hysterectomy.    Exercising: No.  exercise Smoker:  no  Health Maintenance: Pap:  6/11 neg HPV HR neg MMG:  12-12-14 category c density,birads 2:neg Colonoscopy:  2016, f/u 30yrs BMD:   2015, did reclast & scheduled for BMD  2017 TDaP:  Pt to check with other provider Shingles: no Pneumonia: no Hep C and HIV: unsure Labs: pcp Self breast exam: done occ   reports that she has never smoked. She has never used smokeless tobacco. She reports that she does not drink alcohol or use illicit drugs.  Past Medical History  Diagnosis Date  . Migraines   . Osteoporosis     on reclast  . Thyroid nodule     u/s 2011 neg  . IBS (irritable bowel syndrome)   . Fibroid   . Menorrhagia     Past Surgical History  Procedure Laterality Date  . Vaginal hysterectomy  2005  . Breast surgery  [id number]    fibroadenoma in rt & left breast  . Cholecystectomy    . Tonsillectomy      Current Outpatient Prescriptions  Medication Sig Dispense Refill  . Acetaminophen (TYLENOL PO) Take by mouth as needed.    . Calcium Carbonate-Vitamin D (CALCIUM + D PO) Take by mouth daily.    . Cholecalciferol  (VITAMIN D3) 2000 units capsule Take by mouth.    . estradiol (ESTRACE) 1 MG tablet TAKE ONE TABLET BY MOUTH EVERY DAY 30 tablet 0  . fluticasone (FLONASE) 50 MCG/ACT nasal spray Place into both nostrils as needed for allergies or rhinitis.    Marland Kitchen GARLIC PO Take by mouth.    . loratadine (CLARITIN) 10 MG tablet Take 10 mg by mouth as needed.     . Multiple Vitamins-Minerals (MULTIVITAMIN PO) Take by mouth daily.    . Probiotic Product (PROBIOTIC PO) Take by mouth daily.    . SUMAtriptan (IMITREX) 50 MG tablet TAKE 1 TABLET BY MOUTH AT ONSET OF MIGRAINE MAY REPEAT ONCE IN 2 HOURS AS NEEDED FOR MIGRAINE OR HEADACHE 18 tablet 1   No current facility-administered medications for this visit.    Family History  Problem Relation Age of Onset  . Hypertension Mother   . Osteoporosis Mother   . Stroke Mother   . Thyroid disease Mother   . Cancer Father     bladder  . Hypertension Father   . Stroke Father   . Hypertension Sister   . Hypertension Brother   . Diabetes Maternal Grandmother     ROS:  Pertinent items are noted in HPI.  Otherwise, a comprehensive ROS was negative.  Exam:   BP 108/62 mmHg  Pulse 68  Resp 16  Ht 5' 3.5" (1.613 m)  Wt 127 lb (57.607 kg)  BMI 22.14 kg/m2  LMP 07/23/2003 Height: 5' 3.5" (161.3 cm) Ht Readings from Last 3 Encounters:  08/01/15 5' 3.5" (1.613 m)  08/30/14 5\' 4"  (1.626 m)  06/06/14 5\' 4"  (1.626 m)    General appearance: alert, cooperative and appears stated age Head: Normocephalic, without obvious abnormality, atraumatic Neck: no adenopathy, supple, symmetrical, trachea midline and thyroid normal to inspection and palpation Lungs: clear to auscultation bilaterally Breasts: normal appearance, no masses or tenderness, No nipple retraction or dimpling, No nipple discharge or bleeding, No axillary or supraclavicular adenopathy Heart: regular rate and rhythm Abdomen: soft, non-tender; no masses,  no organomegaly Extremities: extremities normal,  atraumatic, no cyanosis or edema Skin: Skin color, texture, turgor normal. No rashes or lesions Lymph nodes: Cervical, supraclavicular, and axillary nodes normal. No abnormal inguinal nodes palpated Neurologic: Grossly normal   Pelvic: External genitalia:  no lesions              Urethra:  normal appearing urethra with no masses, tenderness or lesions              Bartholin's and Skene's: normal                 Vagina: normal appearing vagina with normal color and discharge, no lesions              Cervix: absent              Pap taken: No. Bimanual Exam:  Uterus:  uterus absent              Adnexa: normal adnexa and no mass, fullness, tenderness               Rectovaginal: Confirms               Anus:  normal sphincter tone, no lesions  Chaperone present: yes  A:  Well Woman with normal exam  Menopausal on ERT S/P TVH due to fibroids, ovaries retained  Osteoporosis with PCP management  History of Migraine headaches with Imitrex use, working well desires continuance  Social stress with spouse with ALS, but good support system  P:   Reviewed health and wellness pertinent to exam  Patient desires continuance, patient will advise if positive for Factor V Leiden and will need to stop ERT. Discussed risks and benefits of ERT and would like to continue if possible with Osteoporosis history.  Rx Estrace see order  Rx Imitrex see order  Pap smear as above not taken  Continue to seek support system as needed.   counseled on breast self exam, mammography screening, use and side effects of HRT, adequate intake of calcium and vitamin D, diet and exercise  return annually or prn  An After Visit Summary was printed and given to the patient.

## 2015-08-02 ENCOUNTER — Other Ambulatory Visit: Payer: Self-pay | Admitting: Certified Nurse Midwife

## 2015-08-02 NOTE — Telephone Encounter (Signed)
I left patient a message letting her know that the RX was already sent in -eh

## 2015-08-02 NOTE — Telephone Encounter (Signed)
Patient was seen yesterday and forgot to ask for a refill for Estradiol. She is using [facility] @ [address].

## 2015-10-31 DIAGNOSIS — M5416 Radiculopathy, lumbar region: Secondary | ICD-10-CM | POA: Diagnosis not present

## 2015-11-27 ENCOUNTER — Other Ambulatory Visit: Payer: Self-pay

## 2015-11-27 DIAGNOSIS — Z1231 Encounter for screening mammogram for malignant neoplasm of breast: Secondary | ICD-10-CM

## 2015-12-26 ENCOUNTER — Ambulatory Visit: Payer: Managed Care, Other (non HMO)

## 2016-01-01 ENCOUNTER — Ambulatory Visit: Payer: Managed Care, Other (non HMO)

## 2016-02-05 ENCOUNTER — Ambulatory Visit
Admission: RE | Admit: 2016-02-05 | Discharge: 2016-02-05 | Disposition: A | Discharge status: Home / Self Care | Payer: BLUE CROSS/BLUE SHIELD | Source: Ambulatory Visit

## 2016-02-05 DIAGNOSIS — Z1231 Encounter for screening mammogram for malignant neoplasm of breast: Secondary | ICD-10-CM

## 2016-03-05 DIAGNOSIS — R300 Dysuria: Secondary | ICD-10-CM | POA: Diagnosis not present

## 2016-05-07 ENCOUNTER — Other Ambulatory Visit: Payer: Self-pay

## 2016-05-07 DIAGNOSIS — M810 Age-related osteoporosis without current pathological fracture: Secondary | ICD-10-CM | POA: Diagnosis not present

## 2016-05-07 DIAGNOSIS — N951 Menopausal and female climacteric states: Secondary | ICD-10-CM

## 2016-05-07 DIAGNOSIS — Z8669 Personal history of other diseases of the nervous system and sense organs: Secondary | ICD-10-CM

## 2016-05-07 MED ORDER — ESTRADIOL 1 MG PO TABS: 1.0000 mg | ORAL_TABLET | Freq: Every day | ORAL | 0 refills | Status: DC

## 2016-05-07 NOTE — Telephone Encounter (Signed)
Medication refill request: Estradiol 1mg  Last AEX:  08/01/15 DL Next AEX: 08/06/16 Last MMG (if hormonal medication request): 02/05/16 BIRADS 1 negative Refill authorized: 08/01/15 #30 w/12 refills; request is for 90-day supply

## 2016-05-15 DIAGNOSIS — R10814 Left lower quadrant abdominal tenderness: Secondary | ICD-10-CM | POA: Diagnosis not present

## 2016-05-15 DIAGNOSIS — R197 Diarrhea, unspecified: Secondary | ICD-10-CM | POA: Diagnosis not present

## 2016-05-15 DIAGNOSIS — Z6821 Body mass index (BMI) 21.0-21.9, adult: Secondary | ICD-10-CM | POA: Diagnosis not present

## 2016-05-15 DIAGNOSIS — R1031 Right lower quadrant pain: Secondary | ICD-10-CM | POA: Diagnosis not present

## 2016-06-10 DIAGNOSIS — Z6820 Body mass index (BMI) 20.0-20.9, adult: Secondary | ICD-10-CM | POA: Diagnosis not present

## 2016-06-10 DIAGNOSIS — R10817 Generalized abdominal tenderness: Secondary | ICD-10-CM | POA: Diagnosis not present

## 2016-06-10 DIAGNOSIS — R197 Diarrhea, unspecified: Secondary | ICD-10-CM | POA: Diagnosis not present

## 2016-06-11 ENCOUNTER — Other Ambulatory Visit: Payer: Self-pay | Admitting: Internal Medicine

## 2016-06-11 DIAGNOSIS — R109 Unspecified abdominal pain: Secondary | ICD-10-CM

## 2016-06-17 ENCOUNTER — Other Ambulatory Visit: Payer: Self-pay | Admitting: Internal Medicine

## 2016-06-17 ENCOUNTER — Inpatient Hospital Stay: Admission: RE | Admit: 2016-06-17 | Payer: BLUE CROSS/BLUE SHIELD | Source: Ambulatory Visit

## 2016-06-17 ENCOUNTER — Ambulatory Visit
Admission: RE | Admit: 2016-06-17 | Discharge: 2016-06-17 | Disposition: A | Discharge status: Home / Self Care | Payer: BLUE CROSS/BLUE SHIELD | Source: Ambulatory Visit | Attending: Internal Medicine | Admitting: Internal Medicine

## 2016-06-17 DIAGNOSIS — E559 Vitamin D deficiency, unspecified: Secondary | ICD-10-CM | POA: Diagnosis not present

## 2016-06-17 DIAGNOSIS — Z0000 Encounter for general adult medical examination without abnormal findings: Secondary | ICD-10-CM | POA: Diagnosis not present

## 2016-06-17 DIAGNOSIS — R1031 Right lower quadrant pain: Secondary | ICD-10-CM | POA: Diagnosis not present

## 2016-06-17 DIAGNOSIS — R109 Unspecified abdominal pain: Secondary | ICD-10-CM

## 2016-06-17 DIAGNOSIS — K5730 Diverticulosis of large intestine without perforation or abscess without bleeding: Secondary | ICD-10-CM | POA: Diagnosis not present

## 2016-06-17 DIAGNOSIS — Z6821 Body mass index (BMI) 21.0-21.9, adult: Secondary | ICD-10-CM | POA: Diagnosis not present

## 2016-06-17 DIAGNOSIS — R10814 Left lower quadrant abdominal tenderness: Secondary | ICD-10-CM | POA: Diagnosis not present

## 2016-06-17 DIAGNOSIS — R197 Diarrhea, unspecified: Secondary | ICD-10-CM | POA: Diagnosis not present

## 2016-06-17 MED ORDER — IOHEXOL 300 MG/ML  SOLN
30.0000 mL | Freq: Once | INTRAMUSCULAR | Status: AC | PRN
  Administered 2016-06-17: 30 mL via ORAL

## 2016-06-17 MED ORDER — IOPAMIDOL (ISOVUE-300) INJECTION 61%
100.0000 mL | Freq: Once | INTRAVENOUS | Status: AC | PRN
  Administered 2016-06-17: 100 mL via INTRAVENOUS

## 2016-06-20 DIAGNOSIS — K5730 Diverticulosis of large intestine without perforation or abscess without bleeding: Secondary | ICD-10-CM | POA: Diagnosis not present

## 2016-06-20 DIAGNOSIS — R933 Abnormal findings on diagnostic imaging of other parts of digestive tract: Secondary | ICD-10-CM | POA: Diagnosis not present

## 2016-06-20 DIAGNOSIS — R194 Change in bowel habit: Secondary | ICD-10-CM | POA: Diagnosis not present

## 2016-06-20 DIAGNOSIS — R109 Unspecified abdominal pain: Secondary | ICD-10-CM | POA: Diagnosis not present

## 2016-06-24 ENCOUNTER — Other Ambulatory Visit: Payer: BLUE CROSS/BLUE SHIELD

## 2016-06-24 DIAGNOSIS — K635 Polyp of colon: Secondary | ICD-10-CM | POA: Diagnosis not present

## 2016-06-24 DIAGNOSIS — D120 Benign neoplasm of cecum: Secondary | ICD-10-CM | POA: Diagnosis not present

## 2016-06-24 DIAGNOSIS — Z1211 Encounter for screening for malignant neoplasm of colon: Secondary | ICD-10-CM | POA: Diagnosis not present

## 2016-06-24 DIAGNOSIS — K6389 Other specified diseases of intestine: Secondary | ICD-10-CM | POA: Diagnosis not present

## 2016-06-24 DIAGNOSIS — R933 Abnormal findings on diagnostic imaging of other parts of digestive tract: Secondary | ICD-10-CM | POA: Diagnosis not present

## 2016-06-24 DIAGNOSIS — K5730 Diverticulosis of large intestine without perforation or abscess without bleeding: Secondary | ICD-10-CM | POA: Diagnosis not present

## 2016-07-01 DIAGNOSIS — F329 Major depressive disorder, single episode, unspecified: Secondary | ICD-10-CM | POA: Diagnosis not present

## 2016-07-01 DIAGNOSIS — R1084 Generalized abdominal pain: Secondary | ICD-10-CM | POA: Diagnosis not present

## 2016-07-01 DIAGNOSIS — Z0000 Encounter for general adult medical examination without abnormal findings: Secondary | ICD-10-CM | POA: Diagnosis not present

## 2016-07-01 DIAGNOSIS — R197 Diarrhea, unspecified: Secondary | ICD-10-CM | POA: Diagnosis not present

## 2016-07-01 DIAGNOSIS — Z6821 Body mass index (BMI) 21.0-21.9, adult: Secondary | ICD-10-CM | POA: Diagnosis not present

## 2016-07-01 DIAGNOSIS — Z1389 Encounter for screening for other disorder: Secondary | ICD-10-CM | POA: Diagnosis not present

## 2016-07-01 DIAGNOSIS — Z832 Family history of diseases of the blood and blood-forming organs and certain disorders involving the immune mechanism: Secondary | ICD-10-CM | POA: Diagnosis not present

## 2016-07-01 DIAGNOSIS — M545 Low back pain: Secondary | ICD-10-CM | POA: Diagnosis not present

## 2016-08-05 ENCOUNTER — Other Ambulatory Visit: Payer: Self-pay | Admitting: Certified Nurse Midwife

## 2016-08-05 DIAGNOSIS — Z8669 Personal history of other diseases of the nervous system and sense organs: Secondary | ICD-10-CM

## 2016-08-05 DIAGNOSIS — N951 Menopausal and female climacteric states: Secondary | ICD-10-CM

## 2016-08-05 NOTE — Telephone Encounter (Signed)
Medication refill request: Estradiol  Last AEX:  08-01-15  Next AEX: 08-06-16  Last MMG (if hormonal medication request): 02-05-16 WNL  Refill authorized: please advise

## 2016-08-05 NOTE — Telephone Encounter (Signed)
Will hold until seen 1/16

## 2016-08-06 ENCOUNTER — Ambulatory Visit (INDEPENDENT_AMBULATORY_CARE_PROVIDER_SITE_OTHER): Payer: BLUE CROSS/BLUE SHIELD | Admitting: Certified Nurse Midwife

## 2016-08-06 ENCOUNTER — Encounter: Payer: Self-pay | Admitting: Certified Nurse Midwife

## 2016-08-06 VITALS — BP 100/70 | HR 70 | Resp 16 | Ht 63.5 in | Wt 126.0 lb

## 2016-08-06 DIAGNOSIS — Z01419 Encounter for gynecological examination (general) (routine) without abnormal findings: Secondary | ICD-10-CM

## 2016-08-06 DIAGNOSIS — Z8669 Personal history of other diseases of the nervous system and sense organs: Secondary | ICD-10-CM

## 2016-08-06 DIAGNOSIS — N951 Menopausal and female climacteric states: Secondary | ICD-10-CM | POA: Diagnosis not present

## 2016-08-06 MED ORDER — ESTRADIOL 1 MG PO TABS: 1.0000 mg | ORAL_TABLET | Freq: Every day | ORAL | 4 refills | Status: DC

## 2016-08-06 NOTE — Progress Notes (Signed)
61 y.o. [id number] Married  Caucasian Fe here for annual exam. Menopausal  No HRT. Denies vaginal bleeding/dryness. Recent episode with IBS for the first time. Resolved and feeling so much better. Spouse terminal with ALS and now working with Hospice. Family,church,friends very supportive and helpful. Emotionally Ok. Sees PCP for labs and aex, all normal.  Patient's last menstrual period was 07/23/2003.          Sexually active: Yes.    The current method of family planning is status post hysterectomy.    Exercising: No.  exercise Smoker:  no  Health Maintenance: Pap:  6/11 neg HPV HR neg MMG:  02-05-16 category c density birads 1:neg Colonoscopy:  2016 f/u 21yrs & 2017 diverticulitis 1 polyp benign f/u 7yrs BMD:   2017 pcp manages TDaP:  Pt to check with other provider Shingles: no Pneumonia: had years ago Hep C and HIV: not done, declines Labs: pcp Self breast exam: done occ   reports that she has never smoked. She has never used smokeless tobacco. She reports that she does not drink alcohol or use drugs.  Past Medical History:  Diagnosis Date  . Diverticulitis   . Fibroid   . IBS (irritable bowel syndrome)   . Menorrhagia   . Migraines   . Osteoporosis    on reclast  . Thyroid nodule    u/s 2011 neg    Past Surgical History:  Procedure Laterality Date  . BREAST SURGERY  [id number]   fibroadenoma in rt & left breast  . CHOLECYSTECTOMY    . TONSILLECTOMY    . VAGINAL HYSTERECTOMY  2005    Current Outpatient Prescriptions  Medication Sig Dispense Refill  . Acetaminophen (TYLENOL PO) Take by mouth as needed.    . Calcium Carbonate-Vitamin D (CALCIUM + D PO) Take by mouth daily.    . Cholecalciferol (VITAMIN D3) 2000 units capsule Take by mouth.    . estradiol (ESTRACE) 1 MG tablet Take 1 tablet (1 mg total) by mouth daily. 90 tablet 0  . fluticasone (FLONASE) 50 MCG/ACT nasal spray Place into both nostrils as needed for allergies or rhinitis.    Marland Kitchen GARLIC PO Take by  mouth.    . loratadine (CLARITIN) 10 MG tablet Take 10 mg by mouth as needed.     . Multiple Vitamins-Minerals (MULTIVITAMIN PO) Take by mouth daily.    . Probiotic Product (PROBIOTIC PO) Take by mouth daily.    . SUMAtriptan (IMITREX) 50 MG tablet Take 1 tablet (50 mg total) by mouth once. May repeat in 2 hours if headache persists or recurs. 18 tablet 2   No current facility-administered medications for this visit.     Family History  Problem Relation Age of Onset  . Hypertension Mother   . Osteoporosis Mother   . Stroke Mother   . Thyroid disease Mother   . Cancer Father     bladder  . Hypertension Father   . Stroke Father   . Hypertension Sister   . Hypertension Brother   . Other Brother     Factor 3  . Diabetes Maternal Grandmother     ROS:  Pertinent items are noted in HPI.  Otherwise, a comprehensive ROS was negative.  Exam:   BP 100/70   Pulse 70   Resp 16   Ht 5' 3.5" (1.613 m)   Wt 126 lb (57.2 kg)   LMP 07/23/2003   BMI 21.97 kg/m  Height: 5' 3.5" (161.3 cm) Ht Readings from Last 3 Encounters:  08/06/16 5' 3.5" (1.613 m)  08/01/15 5' 3.5" (1.613 m)  08/30/14 5\' 4"  (1.626 m)    General appearance: alert, cooperative and appears stated age Head: Normocephalic, without obvious abnormality, atraumatic Neck: no adenopathy, supple, symmetrical, trachea midline and thyroid normal to inspection and palpation Lungs: clear to auscultation bilaterally Breasts: normal appearance, no masses or tenderness, No nipple retraction or dimpling, No nipple discharge or bleeding, No axillary or supraclavicular adenopathy Heart: regular rate and rhythm Abdomen: soft, non-tender; no masses,  no organomegaly Extremities: extremities normal, atraumatic, no cyanosis or edema Skin: Skin color, texture, turgor normal. No rashes or lesions Lymph nodes: Cervical, supraclavicular, and axillary nodes normal. No abnormal inguinal nodes palpated Neurologic: Grossly normal   Pelvic:  External genitalia:  no lesions              Urethra:  normal appearing urethra with no masses, tenderness or lesions              Bartholin's and Skene's: normal                 Vagina: normal appearing vagina with normal color and discharge, no lesions              Cervix: absent              Pap taken: No. Bimanual Exam:  Uterus:  uterus absent              Adnexa: normal adnexa and no mass, fullness, tenderness               Rectovaginal: Confirms               Anus:  normal sphincter tone, no lesions  Chaperone present: yes   A:  Well Woman with normal exam  Menopausal on ERT S/P  IBS first occurrence with GI management  Social stress with spouse with progressive ALS  P:   Reviewed health and wellness pertinent to exam  Discussed risks and benefits of ERT, warning signs and need to advise if occurs. Desires continuance at current dose. Discussed decreasing at next aex and weaning off if possible by age 34. Questions addressed.  Continue follow up with GI as indicated  Encouraged to take time for self while caring for spouse.  Pap smear as above not taken   counseled on breast self exam, mammography screening, use and side effects of HRT, adequate intake of calcium and vitamin D, diet and exercise  return annually or prn  An After Visit Summary was printed and given to the patient.

## 2016-08-06 NOTE — Patient Instructions (Signed)

## 2016-08-08 NOTE — Progress Notes (Signed)
Encounter reviewed Stacey Belizaire, MD   

## 2016-08-29 ENCOUNTER — Encounter: Payer: Self-pay | Admitting: Certified Nurse Midwife

## 2016-08-29 DIAGNOSIS — R1032 Left lower quadrant pain: Secondary | ICD-10-CM | POA: Diagnosis not present

## 2016-08-29 DIAGNOSIS — R1033 Periumbilical pain: Secondary | ICD-10-CM | POA: Diagnosis not present

## 2016-08-29 DIAGNOSIS — K5720 Diverticulitis of large intestine with perforation and abscess without bleeding: Secondary | ICD-10-CM | POA: Diagnosis not present

## 2016-10-01 DIAGNOSIS — R1084 Generalized abdominal pain: Secondary | ICD-10-CM | POA: Diagnosis not present

## 2016-10-01 DIAGNOSIS — R748 Abnormal levels of other serum enzymes: Secondary | ICD-10-CM | POA: Diagnosis not present

## 2016-10-01 DIAGNOSIS — K5730 Diverticulosis of large intestine without perforation or abscess without bleeding: Secondary | ICD-10-CM | POA: Diagnosis not present

## 2016-10-08 DIAGNOSIS — K5730 Diverticulosis of large intestine without perforation or abscess without bleeding: Secondary | ICD-10-CM | POA: Diagnosis not present

## 2016-10-08 DIAGNOSIS — K219 Gastro-esophageal reflux disease without esophagitis: Secondary | ICD-10-CM | POA: Diagnosis not present

## 2016-10-08 DIAGNOSIS — R748 Abnormal levels of other serum enzymes: Secondary | ICD-10-CM | POA: Diagnosis not present

## 2016-10-14 DIAGNOSIS — R300 Dysuria: Secondary | ICD-10-CM | POA: Diagnosis not present

## 2016-10-15 DIAGNOSIS — R748 Abnormal levels of other serum enzymes: Secondary | ICD-10-CM | POA: Diagnosis not present

## 2016-10-15 DIAGNOSIS — K5730 Diverticulosis of large intestine without perforation or abscess without bleeding: Secondary | ICD-10-CM | POA: Diagnosis not present

## 2016-10-15 DIAGNOSIS — M549 Dorsalgia, unspecified: Secondary | ICD-10-CM | POA: Diagnosis not present

## 2016-12-19 ENCOUNTER — Other Ambulatory Visit: Payer: Self-pay | Admitting: Gastroenterology

## 2016-12-19 DIAGNOSIS — K5732 Diverticulitis of large intestine without perforation or abscess without bleeding: Secondary | ICD-10-CM | POA: Diagnosis not present

## 2016-12-19 DIAGNOSIS — R1032 Left lower quadrant pain: Secondary | ICD-10-CM

## 2016-12-19 DIAGNOSIS — R110 Nausea: Secondary | ICD-10-CM

## 2016-12-20 ENCOUNTER — Ambulatory Visit
Admission: RE | Admit: 2016-12-20 | Discharge: 2016-12-20 | Disposition: A | Discharge status: Home / Self Care | Payer: BLUE CROSS/BLUE SHIELD | Source: Ambulatory Visit | Attending: Gastroenterology | Admitting: Gastroenterology

## 2016-12-20 DIAGNOSIS — R110 Nausea: Secondary | ICD-10-CM

## 2016-12-20 DIAGNOSIS — R1032 Left lower quadrant pain: Secondary | ICD-10-CM

## 2016-12-20 DIAGNOSIS — K5900 Constipation, unspecified: Secondary | ICD-10-CM | POA: Diagnosis not present

## 2016-12-20 MED ORDER — IOPAMIDOL (ISOVUE-300) INJECTION 61%
100.0000 mL | Freq: Once | INTRAVENOUS | Status: AC | PRN
  Administered 2016-12-20: 100 mL via INTRAVENOUS

## 2016-12-24 ENCOUNTER — Ambulatory Visit (INDEPENDENT_AMBULATORY_CARE_PROVIDER_SITE_OTHER): Payer: BLUE CROSS/BLUE SHIELD | Admitting: Certified Nurse Midwife

## 2016-12-24 ENCOUNTER — Encounter: Payer: Self-pay | Admitting: Certified Nurse Midwife

## 2016-12-24 VITALS — BP 100/68 | HR 68 | Temp 97.5°F | Resp 16 | Ht 63.5 in | Wt 128.0 lb

## 2016-12-24 DIAGNOSIS — R309 Painful micturition, unspecified: Secondary | ICD-10-CM

## 2016-12-24 DIAGNOSIS — R3915 Urgency of urination: Secondary | ICD-10-CM

## 2016-12-24 DIAGNOSIS — J3089 Other allergic rhinitis: Secondary | ICD-10-CM | POA: Insufficient documentation

## 2016-12-24 DIAGNOSIS — R102 Pelvic and perineal pain: Secondary | ICD-10-CM | POA: Diagnosis not present

## 2016-12-24 DIAGNOSIS — G43909 Migraine, unspecified, not intractable, without status migrainosus: Secondary | ICD-10-CM | POA: Insufficient documentation

## 2016-12-24 LAB — POCT URINALYSIS DIPSTICK
Bilirubin, UA: NEGATIVE
Blood, UA: NEGATIVE
Glucose, UA: NEGATIVE
Ketones, UA: NEGATIVE
Leukocytes, UA: NEGATIVE
NITRITE UA: NEGATIVE
Protein, UA: NEGATIVE
UROBILINOGEN UA: NEGATIVE U/dL — AB
pH, UA: 5 (ref 5.0–8.0)

## 2016-12-24 MED ORDER — PHENAZOPYRIDINE HCL 100 MG PO TABS: 100.0000 mg | ORAL_TABLET | Freq: Three times a day (TID) | ORAL | 0 refills | Status: DC | PRN

## 2016-12-24 NOTE — Progress Notes (Signed)
Subjective:     Patient ID: Stacey Calhoun, female   DOB: Nov 28, 1955, 61 y.o.   MRN: [id number]  HPI 61 yo white married female here with complaint of lower pelvic pressure, bladder pressure with cramping ? Bladder ? Bowel ? Has sharp pain with urination at times. Urine today negative. Currently having loose stools, 3 this am but on probiotic per Dr. Collene Mares. Has been on good diet with GI evaluation for of problems. Saw Dr. Collene Mares for evaluation had CT scan on 12/20/16 which did not indicate source of problems. No inflammatory areas noted in GI areas. Dr. Collene Mares feels not GI at present, felt she should have pelvic evaluation, per patient. Pelvic area ovaries not noted, but had TVH with ovaries retained. Main complaint is urinary pain, cramping in bladder area, pelvic area, with occasional burning type pain, urinary urgency. Pain is relieved with emptying even small amount at times. Spouse with hospice now with ALS diagnosis. Patient caring for him daily with a helper one day a week for her to do errands and have time out. Emotionally "doing the best I can". Denies fever, chills, back pain or nausea. No other complaints today.   Review of Systems  Constitutional: Positive for fatigue.  HENT: Negative.   Eyes: Negative.   Respiratory: Negative.   Cardiovascular: Negative.   Gastrointestinal:       Patient has loose stools at times and bloating, but resolves.  Endocrine: Negative.   Genitourinary: Positive for dysuria, frequency and pelvic pain. Negative for hematuria and vaginal discharge.       Patient has some pelvic pain at times when the pain occurs, but no vaginal dryness  Skin: Negative.   Allergic/Immunologic: Negative.   Neurological: Negative.   Hematological: Negative.   Psychiatric/Behavioral:       Emotionally stresssed.       Objective:   Physical Exam  Constitutional: She appears well-developed and well-nourished.  Abdominal: Soft. She exhibits no distension and no mass. There is  tenderness. There is no rebound and no guarding.  Suprapubic tenderness with palpation and then recheck none  Genitourinary: Vagina normal. There is no rash, tenderness or lesion on the right labia. There is no rash, tenderness or lesion on the left labia. Right adnexum displays no mass, no tenderness and no fullness. Left adnexum displays no mass, no tenderness and no fullness. No erythema in the vagina. No vaginal discharge found.  Genitourinary Comments: Affirm collected  Lymphadenopathy:       Right: Inguinal adenopathy present.       Left: No inguinal adenopathy present.  Neurological: She is alert.  Skin: Skin is warm and dry. No pallor.  Psychiatric: She has a normal mood and affect. Her behavior is normal. Thought content normal.  Uterus absent and adnexal non tender, normal to palpation With slight tenderness on left, but palpated again and no tenderness    Assessment:    History of continued pelvic/abdominal pain with negative CT findings Normal pelvic exam  S/P TVH due to fibroid and bleeding, ovaries retained R/O UTI ? Interstitial Cystitis R/O vaginal pathogens    Plan:     Discussed findings with patient and would like to check urine for culture to be sure no infection. Patient agreeable. Discussed her symptoms and history with onset after spouse illness. Given information on IC and IC diet to try to see if this is possibly the cause and not infection. Also discussed trial of Pyridium to see if symptoms decrease while waiting on urine  culture. Patient would like to try. Rx Pyridium see order with instructions and no more than 4 day use. If symptoms resolve or decrease with negative culture will refer to Urology. Discussed no pelvic or vaginal findings, but will check with wet prep. Lab Affirm will treat if indicated. Given warning signs of severe pelvic and abdominal pain and need to advise. Patient agreeable. Discussed emotional distress can also exacerbate other symptoms  and encouraged to take time out when help is there.  RV prn as above

## 2016-12-24 NOTE — Patient Instructions (Signed)

## 2016-12-25 LAB — URINE CULTURE: Organism ID, Bacteria: NO GROWTH

## 2016-12-26 LAB — VAGINITIS/VAGINOSIS, DNA PROBE
Candida Species: NEGATIVE
GARDNERELLA VAGINALIS: NEGATIVE
Trichomonas vaginosis: NEGATIVE

## 2017-02-04 ENCOUNTER — Telehealth: Payer: Self-pay | Admitting: Certified Nurse Midwife

## 2017-02-04 DIAGNOSIS — R3915 Urgency of urination: Secondary | ICD-10-CM

## 2017-02-04 DIAGNOSIS — R102 Pelvic and perineal pain: Secondary | ICD-10-CM

## 2017-02-04 DIAGNOSIS — R309 Painful micturition, unspecified: Secondary | ICD-10-CM

## 2017-02-04 NOTE — Telephone Encounter (Signed)
Patient is asking for a referral to [facility].

## 2017-02-04 NOTE — Telephone Encounter (Signed)
Spoke with patient. Reports was seen by Melvia Heaps, CNM on 12/24/16, all testing was negative, symptoms possibly interstitial cystitis. Discussed possibility at that time of referral to [facility].  Patient states some days are better than others but  symptoms of pelvic pain and bladder cramping are not resolving. Denies fever, chills, nausea.Reports using Azo for relief. Request referral to [facility], Dr. Matilde Sprang or NP if available for earlier appointment. Advised patient would review with Melvia Heaps, CNM and return call with recommendations, patient is agreeable.    Melvia Heaps, CNM -please advise on referral to [facility]?

## 2017-02-04 NOTE — Telephone Encounter (Signed)
We had discussed this previously and if not resolving or improved to be seen by [facility]. Proceed with referral

## 2017-02-04 NOTE — Telephone Encounter (Signed)
Spoke with patient, advised order placed for referral to [facility] for Dr. Matilde Sprang or NP, first available. Advised patient our referral coordinator will f/u with scheduling. Patient verbalizes understanding, thankful for assistance and is agreeable.   Patient is agreeable to disposition. Will close encounter.   Cc: Theresia Lo

## 2017-02-05 DIAGNOSIS — R350 Frequency of micturition: Secondary | ICD-10-CM | POA: Diagnosis not present

## 2017-02-24 DIAGNOSIS — M546 Pain in thoracic spine: Secondary | ICD-10-CM | POA: Diagnosis not present

## 2017-02-24 DIAGNOSIS — M6281 Muscle weakness (generalized): Secondary | ICD-10-CM | POA: Diagnosis not present

## 2017-02-24 DIAGNOSIS — R350 Frequency of micturition: Secondary | ICD-10-CM | POA: Diagnosis not present

## 2017-02-24 DIAGNOSIS — M545 Low back pain: Secondary | ICD-10-CM | POA: Diagnosis not present

## 2017-02-28 ENCOUNTER — Other Ambulatory Visit: Payer: Self-pay | Admitting: Certified Nurse Midwife

## 2017-02-28 DIAGNOSIS — Z1231 Encounter for screening mammogram for malignant neoplasm of breast: Secondary | ICD-10-CM

## 2017-03-11 DIAGNOSIS — M545 Low back pain: Secondary | ICD-10-CM | POA: Diagnosis not present

## 2017-03-11 DIAGNOSIS — R350 Frequency of micturition: Secondary | ICD-10-CM | POA: Diagnosis not present

## 2017-03-11 DIAGNOSIS — M6281 Muscle weakness (generalized): Secondary | ICD-10-CM | POA: Diagnosis not present

## 2017-03-11 DIAGNOSIS — R300 Dysuria: Secondary | ICD-10-CM | POA: Diagnosis not present

## 2017-03-11 DIAGNOSIS — M546 Pain in thoracic spine: Secondary | ICD-10-CM | POA: Diagnosis not present

## 2017-03-11 DIAGNOSIS — M62838 Other muscle spasm: Secondary | ICD-10-CM | POA: Diagnosis not present

## 2017-03-13 ENCOUNTER — Ambulatory Visit: Payer: BLUE CROSS/BLUE SHIELD

## 2017-03-20 ENCOUNTER — Ambulatory Visit: Payer: BLUE CROSS/BLUE SHIELD

## 2017-04-03 DIAGNOSIS — M545 Low back pain: Secondary | ICD-10-CM | POA: Diagnosis not present

## 2017-04-03 DIAGNOSIS — R300 Dysuria: Secondary | ICD-10-CM | POA: Diagnosis not present

## 2017-04-03 DIAGNOSIS — M62838 Other muscle spasm: Secondary | ICD-10-CM | POA: Diagnosis not present

## 2017-04-03 DIAGNOSIS — M6281 Muscle weakness (generalized): Secondary | ICD-10-CM | POA: Diagnosis not present

## 2017-04-18 DIAGNOSIS — M6281 Muscle weakness (generalized): Secondary | ICD-10-CM | POA: Diagnosis not present

## 2017-04-18 DIAGNOSIS — R102 Pelvic and perineal pain: Secondary | ICD-10-CM | POA: Diagnosis not present

## 2017-04-18 DIAGNOSIS — M62838 Other muscle spasm: Secondary | ICD-10-CM | POA: Diagnosis not present

## 2017-04-18 DIAGNOSIS — R300 Dysuria: Secondary | ICD-10-CM | POA: Diagnosis not present

## 2017-04-24 DIAGNOSIS — R300 Dysuria: Secondary | ICD-10-CM | POA: Diagnosis not present

## 2017-04-24 DIAGNOSIS — R350 Frequency of micturition: Secondary | ICD-10-CM | POA: Diagnosis not present

## 2017-04-24 DIAGNOSIS — M546 Pain in thoracic spine: Secondary | ICD-10-CM | POA: Diagnosis not present

## 2017-04-24 DIAGNOSIS — R102 Pelvic and perineal pain: Secondary | ICD-10-CM | POA: Diagnosis not present

## 2017-05-05 DIAGNOSIS — R102 Pelvic and perineal pain: Secondary | ICD-10-CM | POA: Diagnosis not present

## 2017-05-05 DIAGNOSIS — M6281 Muscle weakness (generalized): Secondary | ICD-10-CM | POA: Diagnosis not present

## 2017-05-05 DIAGNOSIS — M545 Low back pain: Secondary | ICD-10-CM | POA: Diagnosis not present

## 2017-05-05 DIAGNOSIS — M62838 Other muscle spasm: Secondary | ICD-10-CM | POA: Diagnosis not present

## 2017-05-07 DIAGNOSIS — F4323 Adjustment disorder with mixed anxiety and depressed mood: Secondary | ICD-10-CM | POA: Diagnosis not present

## 2017-05-19 DIAGNOSIS — F4323 Adjustment disorder with mixed anxiety and depressed mood: Secondary | ICD-10-CM | POA: Diagnosis not present

## 2017-05-30 DIAGNOSIS — F4323 Adjustment disorder with mixed anxiety and depressed mood: Secondary | ICD-10-CM | POA: Diagnosis not present

## 2017-06-06 DIAGNOSIS — M545 Low back pain: Secondary | ICD-10-CM | POA: Diagnosis not present

## 2017-06-06 DIAGNOSIS — M546 Pain in thoracic spine: Secondary | ICD-10-CM | POA: Diagnosis not present

## 2017-06-06 DIAGNOSIS — M62838 Other muscle spasm: Secondary | ICD-10-CM | POA: Diagnosis not present

## 2017-06-06 DIAGNOSIS — M6281 Muscle weakness (generalized): Secondary | ICD-10-CM | POA: Diagnosis not present

## 2017-06-20 DIAGNOSIS — F4323 Adjustment disorder with mixed anxiety and depressed mood: Secondary | ICD-10-CM | POA: Diagnosis not present

## 2017-06-25 ENCOUNTER — Ambulatory Visit
Admission: RE | Admit: 2017-06-25 | Discharge: 2017-06-25 | Disposition: A | Discharge status: Home / Self Care | Payer: BLUE CROSS/BLUE SHIELD | Source: Ambulatory Visit | Attending: Certified Nurse Midwife | Admitting: Certified Nurse Midwife

## 2017-06-25 DIAGNOSIS — Z1231 Encounter for screening mammogram for malignant neoplasm of breast: Secondary | ICD-10-CM

## 2017-06-26 DIAGNOSIS — M6281 Muscle weakness (generalized): Secondary | ICD-10-CM | POA: Diagnosis not present

## 2017-06-26 DIAGNOSIS — M545 Low back pain: Secondary | ICD-10-CM | POA: Diagnosis not present

## 2017-06-26 DIAGNOSIS — R102 Pelvic and perineal pain: Secondary | ICD-10-CM | POA: Diagnosis not present

## 2017-06-26 DIAGNOSIS — M62838 Other muscle spasm: Secondary | ICD-10-CM | POA: Diagnosis not present

## 2017-07-10 DIAGNOSIS — R102 Pelvic and perineal pain: Secondary | ICD-10-CM | POA: Diagnosis not present

## 2017-07-10 DIAGNOSIS — M6281 Muscle weakness (generalized): Secondary | ICD-10-CM | POA: Diagnosis not present

## 2017-07-10 DIAGNOSIS — M545 Low back pain: Secondary | ICD-10-CM | POA: Diagnosis not present

## 2017-07-10 DIAGNOSIS — M62838 Other muscle spasm: Secondary | ICD-10-CM | POA: Diagnosis not present

## 2017-07-11 DIAGNOSIS — F4323 Adjustment disorder with mixed anxiety and depressed mood: Secondary | ICD-10-CM | POA: Diagnosis not present

## 2017-07-20 DIAGNOSIS — T50Z95A Adverse effect of other vaccines and biological substances, initial encounter: Secondary | ICD-10-CM | POA: Diagnosis not present

## 2017-07-21 DIAGNOSIS — Z0000 Encounter for general adult medical examination without abnormal findings: Secondary | ICD-10-CM | POA: Diagnosis not present

## 2017-07-21 DIAGNOSIS — E559 Vitamin D deficiency, unspecified: Secondary | ICD-10-CM | POA: Diagnosis not present

## 2017-07-21 DIAGNOSIS — R82998 Other abnormal findings in urine: Secondary | ICD-10-CM | POA: Diagnosis not present

## 2017-07-25 DIAGNOSIS — F4323 Adjustment disorder with mixed anxiety and depressed mood: Secondary | ICD-10-CM | POA: Diagnosis not present

## 2017-07-29 DIAGNOSIS — R300 Dysuria: Secondary | ICD-10-CM | POA: Diagnosis not present

## 2017-07-29 DIAGNOSIS — F3289 Other specified depressive episodes: Secondary | ICD-10-CM | POA: Diagnosis not present

## 2017-07-29 DIAGNOSIS — R945 Abnormal results of liver function studies: Secondary | ICD-10-CM | POA: Diagnosis not present

## 2017-07-29 DIAGNOSIS — Z6822 Body mass index (BMI) 22.0-22.9, adult: Secondary | ICD-10-CM | POA: Diagnosis not present

## 2017-07-29 DIAGNOSIS — R197 Diarrhea, unspecified: Secondary | ICD-10-CM | POA: Diagnosis not present

## 2017-07-29 DIAGNOSIS — Z0000 Encounter for general adult medical examination without abnormal findings: Secondary | ICD-10-CM | POA: Diagnosis not present

## 2017-07-29 DIAGNOSIS — Z832 Family history of diseases of the blood and blood-forming organs and certain disorders involving the immune mechanism: Secondary | ICD-10-CM | POA: Diagnosis not present

## 2017-07-29 DIAGNOSIS — Z1389 Encounter for screening for other disorder: Secondary | ICD-10-CM | POA: Diagnosis not present

## 2017-08-08 DIAGNOSIS — F4323 Adjustment disorder with mixed anxiety and depressed mood: Secondary | ICD-10-CM | POA: Diagnosis not present

## 2017-08-12 ENCOUNTER — Encounter: Payer: Self-pay | Admitting: Certified Nurse Midwife

## 2017-08-12 ENCOUNTER — Other Ambulatory Visit: Payer: Self-pay

## 2017-08-12 ENCOUNTER — Ambulatory Visit: Payer: BLUE CROSS/BLUE SHIELD | Admitting: Certified Nurse Midwife

## 2017-08-12 VITALS — BP 120/64 | HR 68 | Resp 16 | Ht 63.5 in | Wt 128.0 lb

## 2017-08-12 DIAGNOSIS — Z01419 Encounter for gynecological examination (general) (routine) without abnormal findings: Secondary | ICD-10-CM | POA: Diagnosis not present

## 2017-08-12 DIAGNOSIS — N898 Other specified noninflammatory disorders of vagina: Secondary | ICD-10-CM | POA: Diagnosis not present

## 2017-08-12 DIAGNOSIS — N951 Menopausal and female climacteric states: Secondary | ICD-10-CM | POA: Diagnosis not present

## 2017-08-12 NOTE — Progress Notes (Signed)
62 y.o. [id number] Married  Caucasian Fe here for annual exam. Menopausal on ERT,but plans to stop, denies vaginal bleeding or vaginal dryness. No hot flashes or night sweats now.[facility] PCP for aex and labs, all normal. Sees Dr Pryor Curia. Seeing Urologist Dr.Winters now for IC and doing therapy with diet, which is working. Complaining today of some vulva itching, no increase in discharge. Using antibacterial soap and feeling irritated. Please check. Spouse has increased symptoms of ALS with weakness, speech is still remaining. Started on Prozac per PCP for depression which is helping. She has stopped working to care for him and has. Hospice  involved now. Using Respite care as needed. Son helping as much possible, stressful time.  Eating well and takes a walk as much as possible, to help with stress. No other health issues today.  Patient's last menstrual period was 07/23/2003.          Sexually active: Yes.    The current method of family planning is status post hysterectomy.    Exercising: No.  exercise Smoker:  no  Health Maintenance: Pap:  6/11 neg HPV HR neg History of Abnormal Pap: no MMG:  06-25-17 category c density birads 1:neg Self Breast exams: yes Colonoscopy:  2016 f/u 96yrs & 2017 diverticulitis 1 polyp benign f/u 35yrs BMD:   2017 pcp manages, aware due this year TDaP:  ?pcp Shingles: had first one done & felt like she had the flu Pneumonia: had done Hep C and HIV: hep c was done with pcp Labs: none   reports that  has never smoked. she has never used smokeless tobacco. She reports that she does not drink alcohol or use drugs.  Past Medical History:  Diagnosis Date  . Diverticulitis   . Fibroid   . IBS (irritable bowel syndrome)   . Menorrhagia   . Migraines   . Osteoporosis    on reclast  . Thyroid nodule    u/s 2011 neg    Past Surgical History:  Procedure Laterality Date  . BREAST SURGERY  2778,2423   fibroadenoma in rt & left breast  . CHOLECYSTECTOMY    .  TONSILLECTOMY    . VAGINAL HYSTERECTOMY  2005    Current Outpatient Medications  Medication Sig Dispense Refill  . Acetaminophen (TYLENOL PO) Take by mouth as needed.    . Calcium Carbonate-Vitamin D (CALCIUM + D PO) Take by mouth daily.    . cetirizine (ZYRTEC) 10 MG tablet Take 10 mg by mouth as needed for allergies.    . Cholecalciferol (VITAMIN D3) 2000 units capsule Take by mouth.    . estradiol (ESTRACE) 1 MG tablet Take 1 tablet (1 mg total) by mouth daily. 90 tablet 4  . FLUoxetine (PROZAC) 10 MG capsule Take by mouth daily.  11  . fluticasone (FLONASE) 50 MCG/ACT nasal spray Place into both nostrils as needed for allergies or rhinitis.    Marland Kitchen GARLIC PO Take by mouth.    . loratadine (CLARITIN) 10 MG tablet Take 10 mg by mouth as needed.     . Multiple Vitamins-Minerals (MULTIVITAMIN PO) Take by mouth daily.    . Probiotic Product (PROBIOTIC PO) Take by mouth daily.    . SUMAtriptan (IMITREX) 50 MG tablet Take 1 tablet (50 mg total) by mouth once. May repeat in 2 hours if headache persists or recurs. 18 tablet 2   No current facility-administered medications for this visit.     Family History  Problem Relation Age of Onset  .  Hypertension Mother   . Osteoporosis Mother   . Stroke Mother   . Thyroid disease Mother   . Cancer Father        bladder  . Hypertension Father   . Stroke Father   . Hypertension Sister   . Hypertension Brother   . Other Brother        Factor 3  . Diabetes Maternal Grandmother     ROS:  Pertinent items are noted in HPI.  Otherwise, a comprehensive ROS was negative.  Exam:   BP 120/64   Pulse 68   Resp 16   Ht 5' 3.5" (1.613 m)   Wt 128 lb (58.1 kg)   LMP 07/23/2003   BMI 22.32 kg/m  Height: 5' 3.5" (161.3 cm) Ht Readings from Last 3 Encounters:  08/12/17 5' 3.5" (1.613 m)  12/24/16 5' 3.5" (1.613 m)  08/06/16 5' 3.5" (1.613 m)    General appearance: alert, cooperative and appears stated age Head: Normocephalic, without obvious  abnormality, atraumatic Neck: no adenopathy, supple, symmetrical, trachea midline and thyroid normal to inspection and palpation Lungs: clear to auscultation bilaterally Breasts: normal appearance, no masses or tenderness, No nipple retraction or dimpling, No nipple discharge or bleeding, No axillary or supraclavicular adenopathy Heart: regular rate and rhythm Abdomen: soft, non-tender; no masses,  no organomegaly Extremities: extremities normal, atraumatic, no cyanosis or edema Skin: Skin color, texture, turgor normal. No rashes or lesions Lymph nodes: Cervical, supraclavicular, and axillary nodes normal. No abnormal inguinal nodes palpated Neurologic: Grossly normal   Pelvic: External genitalia:  no lesions, slight increase pink in vulva creases, no exudate or scaling              Urethra:  normal appearing urethra with no masses, tenderness or lesions              Bartholin's and Skene's: normal                 Vagina: normal appearing vagina with normal color and discharge, no lesions, affirm taken              Cervix: absent              Pap taken: No. Bimanual Exam:  Uterus:  uterus absent              Adnexa: normal adnexa and no mass, fullness, tenderness               Rectovaginal: Confirms               Anus:  normal sphincter tone, no lesions  Chaperone present: yes  A:  Well Woman with normal exam  Menopausal on ERT  R/O vaginal infection  Anxiety, asthma, migraine headache management with MD  Social stress with spouse illness of ALS  BMD due  P:   Reviewed health and wellness pertinent to exam  Discussed risks/benefits/warning signs of ERT. May plan to go off now. Will advise if desires continuation.  Lab: affirm  Continue follow up with MD as indicated  Encouraged patient to seek support as needed in caring for spouse.  Patient will  Schedule at PCP who manages this.  Pap smear: no   counseled on breast self exam, mammography screening, feminine hygiene,  adequate intake of calcium and vitamin D, diet and exercise  return annually or prn  An After Visit Summary was printed and given to the patient.

## 2017-08-12 NOTE — Patient Instructions (Signed)

## 2017-08-13 LAB — VAGINITIS/VAGINOSIS, DNA PROBE
Candida Species: NEGATIVE
Gardnerella vaginalis: NEGATIVE
Trichomonas vaginosis: NEGATIVE

## 2017-08-29 DIAGNOSIS — F4323 Adjustment disorder with mixed anxiety and depressed mood: Secondary | ICD-10-CM | POA: Diagnosis not present

## 2017-09-12 DIAGNOSIS — F4323 Adjustment disorder with mixed anxiety and depressed mood: Secondary | ICD-10-CM | POA: Diagnosis not present

## 2017-10-10 DIAGNOSIS — F4323 Adjustment disorder with mixed anxiety and depressed mood: Secondary | ICD-10-CM | POA: Diagnosis not present

## 2017-10-23 ENCOUNTER — Other Ambulatory Visit: Payer: Self-pay | Admitting: Certified Nurse Midwife

## 2017-10-23 DIAGNOSIS — N951 Menopausal and female climacteric states: Secondary | ICD-10-CM

## 2017-10-23 NOTE — Telephone Encounter (Signed)
Per my note she did not want Rx, was planning to go off of. May note said she would advise if decides to continue. Needs call

## 2017-10-23 NOTE — Telephone Encounter (Signed)
Medication refill request: Estrace  Last AEX:  08-12-17  Next AEX: 08-18-18  Last MMG (if hormonal medication request): 06-25-17 WNL  Refill authorized: please advise

## 2017-10-24 NOTE — Telephone Encounter (Signed)
Spoke with patient and she said that she is not ready to come off of this right now. She is requesting a refill -eh

## 2017-11-14 DIAGNOSIS — F4323 Adjustment disorder with mixed anxiety and depressed mood: Secondary | ICD-10-CM | POA: Diagnosis not present

## 2018-01-16 DIAGNOSIS — F4323 Adjustment disorder with mixed anxiety and depressed mood: Secondary | ICD-10-CM | POA: Diagnosis not present

## 2018-01-30 DIAGNOSIS — F4323 Adjustment disorder with mixed anxiety and depressed mood: Secondary | ICD-10-CM | POA: Diagnosis not present

## 2018-02-19 DIAGNOSIS — Z6822 Body mass index (BMI) 22.0-22.9, adult: Secondary | ICD-10-CM | POA: Diagnosis not present

## 2018-02-19 DIAGNOSIS — J3089 Other allergic rhinitis: Secondary | ICD-10-CM | POA: Diagnosis not present

## 2018-02-19 DIAGNOSIS — F3289 Other specified depressive episodes: Secondary | ICD-10-CM | POA: Diagnosis not present

## 2018-02-19 DIAGNOSIS — J329 Chronic sinusitis, unspecified: Secondary | ICD-10-CM | POA: Diagnosis not present

## 2018-02-24 ENCOUNTER — Other Ambulatory Visit: Payer: Self-pay | Admitting: Internal Medicine

## 2018-02-24 DIAGNOSIS — J329 Chronic sinusitis, unspecified: Secondary | ICD-10-CM

## 2018-03-04 ENCOUNTER — Ambulatory Visit
Admission: RE | Admit: 2018-03-04 | Discharge: 2018-03-04 | Disposition: A | Discharge status: Home / Self Care | Payer: BLUE CROSS/BLUE SHIELD | Source: Ambulatory Visit | Attending: Internal Medicine | Admitting: Internal Medicine

## 2018-03-04 DIAGNOSIS — J329 Chronic sinusitis, unspecified: Secondary | ICD-10-CM

## 2018-03-31 ENCOUNTER — Ambulatory Visit: Payer: BLUE CROSS/BLUE SHIELD | Admitting: Allergy and Immunology

## 2018-03-31 ENCOUNTER — Encounter: Payer: Self-pay | Admitting: Allergy and Immunology

## 2018-03-31 VITALS — BP 110/84 | HR 72 | Temp 97.9°F | Resp 18 | Ht 63.5 in | Wt 134.8 lb

## 2018-03-31 DIAGNOSIS — J320 Chronic maxillary sinusitis: Secondary | ICD-10-CM | POA: Diagnosis not present

## 2018-03-31 DIAGNOSIS — H1013 Acute atopic conjunctivitis, bilateral: Secondary | ICD-10-CM | POA: Diagnosis not present

## 2018-03-31 DIAGNOSIS — J3089 Other allergic rhinitis: Secondary | ICD-10-CM

## 2018-03-31 DIAGNOSIS — H1010 Acute atopic conjunctivitis, unspecified eye: Secondary | ICD-10-CM | POA: Insufficient documentation

## 2018-03-31 DIAGNOSIS — J329 Chronic sinusitis, unspecified: Secondary | ICD-10-CM | POA: Insufficient documentation

## 2018-03-31 MED ORDER — FLUTICASONE PROPIONATE 93 MCG/ACT NA EXHU: 2.0000 | INHALANT_SUSPENSION | Freq: Two times a day (BID) | NASAL | 5 refills | Status: DC

## 2018-03-31 MED ORDER — CARBINOXAMINE MALEATE 4 MG PO TABS: 4.0000 mg | ORAL_TABLET | Freq: Three times a day (TID) | ORAL | 5 refills | Status: DC

## 2018-03-31 MED ORDER — OLOPATADINE HCL 0.7 % OP SOLN: 1.0000 [drp] | Freq: Every day | OPHTHALMIC | 5 refills | Status: DC | PRN

## 2018-03-31 NOTE — Assessment & Plan Note (Signed)
   Treatment plan as outlined above for allergic rhinitis.  A prescription has been provided for Pazeo, one drop per eye daily as needed.  I have also recommended eye lubricant drops (i.e., Natural Tears) as needed. 

## 2018-03-31 NOTE — Progress Notes (Signed)
New Patient Note  RE: Stacey Calhoun MRN: [id number] DOB: 1955-10-23 Date of Office Visit: 03/31/2018  Referring provider: Crist Infante, MD Primary care provider: Crist Infante, MD  Chief Complaint: Sinus Problem and Nasal Congestion   History of present illness: Stacey Calhoun is a 62 y.o. female seen today in consultation requested by Crist Infante, MD.  She reports that since January 2019 she has had "chronic sinusitis."  She complains of nasal congestion, rhinorrhea, thick postnasal drainage, ocular pruritus, sinus pressure, and ear pressure.  She has been on antibiotics 3 or 4 times for sinus infections since January.  No significant seasonal variation has been noted.  Specific triggers include strong aromas, such as perfumes, and rapid weather changes.  She has been taking cetirizine and fluticasone nasal spray in an attempt to control the symptoms without adequate symptom relief.  She has no history of symptoms consistent with asthma.  Assessment and plan: Perennial allergic rhinitis with a probable nonallergic component  Aeroallergen avoidance measures have been discussed and provided in written form.  A prescription has been provided for carbinoxamine 4 mg every 8 hours if needed.  A prescription has been provided for [facility], 2 actuations per nostril twice a day. Proper technique has been discussed and demonstrated.  Nasal saline lavage (NeilMed) has been recommended as needed and prior to medicated nasal sprays along with instructions for proper administration.  Chronic sinusitis  Treatment plan as outlined above.  For thick post nasal drainage, nasal congestion, and/or sinus pressure, add guaifenesin [phone] mg (Mucinex) plus/minus pseudoephedrine 60-120 mg  twice daily as needed with adequate hydration as discussed. Pseudoephedrine is only to be used for short-term relief of nasal/sinus congestion. Long-term use is discouraged due to potential side effects.  If this  problem persists or progresses despite compliance with treatment plan as outlined above, further evaluation by Dr. Benjamine Mola, otolaryngology, is recommended.  Allergic conjunctivitis  Treatment plan as outlined above for allergic rhinitis.  A prescription has been provided for Pazeo, one drop per eye daily as needed.  I have also recommended eye lubricant drops (i.e., Natural Tears) as needed.   Meds ordered this encounter  Medications  . Carbinoxamine Maleate 4 MG TABS    Sig: Take 1 tablet (4 mg total) by mouth every 8 (eight) hours.    Dispense:  90 tablet    Refill:  5  . Fluticasone Propionate (XHANCE) 93 MCG/ACT EXHU    Sig: Place 2 sprays into the nose 2 (two) times daily.    Dispense:  16 mL    Refill:  5  . Olopatadine HCl (PAZEO) 0.7 % SOLN    Sig: Place 1 drop into both eyes daily as needed.    Dispense:  1 Bottle    Refill:  5    Diagnostics: Epicutaneous testing: Negative despite a positive histamine control. Intradermal testing: Positive to dust mite antigen.    Physical examination: Blood pressure 110/84, pulse 72, temperature 97.9 F (36.6 C), temperature source Oral, resp. rate 18, height 5' 3.5" (1.613 m), weight 134 lb 12.8 oz (61.1 kg), last menstrual period 07/23/2003, SpO2 98 %.  General: Alert, interactive, in no acute distress. HEENT: TMs pearly gray, turbinates moderately edematous with thick discharge, post-pharynx moderately erythematous. Neck: Supple without lymphadenopathy. Lungs: Clear to auscultation without wheezing, rhonchi or rales. CV: Normal S1, S2 without murmurs. Abdomen: Nondistended, nontender. Skin: Warm and dry, without lesions or rashes. Extremities:  No clubbing, cyanosis or edema. Neuro:   Grossly intact.  Review  of systems:  Review of systems negative except as noted in HPI / PMHx or noted below: Review of Systems  Constitutional: Negative.   HENT: Negative.   Eyes: Negative.   Respiratory: Negative.   Cardiovascular:  Negative.   Gastrointestinal: Negative.   Genitourinary: Negative.   Musculoskeletal: Negative.   Skin: Negative.   Neurological: Negative.   Endo/Heme/Allergies: Negative.   Psychiatric/Behavioral: Negative.     Past medical history:  Past Medical History:  Diagnosis Date  . Diverticulitis   . Fibroid   . IBS (irritable bowel syndrome)   . Interstitial cystitis   . Menorrhagia   . Migraines   . Osteoporosis    on reclast  . Thyroid nodule    u/s 2011 neg    Past surgical history:  Past Surgical History:  Procedure Laterality Date  . BREAST SURGERY  4166,0630   fibroadenoma in rt & left breast  . CHOLECYSTECTOMY    . TONSILLECTOMY    . VAGINAL HYSTERECTOMY  2005    Family history: Family History  Problem Relation Age of Onset  . Hypertension Mother   . Osteoporosis Mother   . Stroke Mother   . Thyroid disease Mother   . Cancer Father        bladder  . Hypertension Father   . Stroke Father   . Hypertension Sister   . Hypertension Brother   . Other Brother        Factor 3  . Diabetes Maternal Grandmother     Social history: Social History   Socioeconomic History  . Marital status: Married    Spouse name: Not on file  . Number of children: Not on file  . Years of education: Not on file  . Highest education level: Not on file  Occupational History  . Not on file  Social Needs  . Financial resource strain: Not on file  . Food insecurity:    Worry: Not on file    Inability: Not on file  . Transportation needs:    Medical: Not on file    Non-medical: Not on file  Tobacco Use  . Smoking status: Never Smoker  . Smokeless tobacco: Never Used  Substance and Sexual Activity  . Alcohol use: No  . Drug use: No  . Sexual activity: Yes    Partners: Male    Birth control/protection: Surgical    Comment: hysterectomy  Lifestyle  . Physical activity:    Days per week: Not on file    Minutes per session: Not on file  . Stress: Not on file    Relationships  . Social connections:    Talks on phone: Not on file    Gets together: Not on file    Attends religious service: Not on file    Active member of club or organization: Not on file    Attends meetings of clubs or organizations: Not on file    Relationship status: Not on file  . Intimate partner violence:    Fear of current or ex partner: Not on file    Emotionally abused: Not on file    Physically abused: Not on file    Forced sexual activity: Not on file  Other Topics Concern  . Not on file  Social History Narrative  . Not on file   Environmental History: The patient lives in a 60 year old house with final floors throughout, gas heat, and central air.  She is a non-smoker without pets.  There is no known mold/water  damage in the home.   Allergies as of 03/31/2018      Reactions   Singulair [montelukast] Other (See Comments)   Headaches, irritability    Sulfa Antibiotics Rash      Medication List        Accurate as of 03/31/18  5:52 PM. Always use your most recent med list.          CALCIUM + D PO Take by mouth daily.   Carbinoxamine Maleate 4 MG Tabs Take 1 tablet (4 mg total) by mouth every 8 (eight) hours.   cetirizine 10 MG tablet Commonly known as:  ZYRTEC Take 10 mg by mouth as needed for allergies.   estradiol 1 MG tablet Commonly known as:  ESTRACE TAKE 1 TABLET BY MOUTH EVERY DAY   FLUoxetine 10 MG capsule Commonly known as:  PROZAC Take by mouth daily.   Fluticasone Propionate 93 MCG/ACT Exhu Place 2 sprays into the nose 2 (two) times daily.   fluticasone 50 MCG/ACT nasal spray Commonly known as:  FLONASE Place into both nostrils as needed for allergies or rhinitis.   GARLIC PO Take by mouth.   moxifloxacin 0.5 % ophthalmic solution Commonly known as:  VIGAMOX INSTILL 1 DROP IN AFFECTED EYE OR EYES THREE TIMES A DAY FOR 7 DAYS   MULTIVITAMIN PO Take by mouth daily.   Olopatadine HCl 0.7 % Soln Place 1 drop into both eyes  daily as needed.   PROBIOTIC PO Take by mouth daily.   SUMAtriptan 50 MG tablet Commonly known as:  IMITREX Take 1 tablet (50 mg total) by mouth once. May repeat in 2 hours if headache persists or recurs.   TYLENOL PO Take by mouth as needed.   Vitamin D3 2000 units capsule Take by mouth.       Known medication allergies: Allergies  Allergen Reactions  . Singulair [Montelukast] Other (See Comments)    Headaches, irritability   . Sulfa Antibiotics Rash    I appreciate the opportunity to take part in Jannah's care. Please do not hesitate to contact me with questions.  Sincerely,   R. Edgar Frisk, MD

## 2018-03-31 NOTE — Assessment & Plan Note (Signed)
   Aeroallergen avoidance measures have been discussed and provided in written form.  A prescription has been provided for carbinoxamine 4 mg every 8 hours if needed.  A prescription has been provided for [facility], 2 actuations per nostril twice a day. Proper technique has been discussed and demonstrated.  Nasal saline lavage (NeilMed) has been recommended as needed and prior to medicated nasal sprays along with instructions for proper administration.

## 2018-03-31 NOTE — Patient Instructions (Addendum)
Perennial allergic rhinitis with a probable nonallergic component  Aeroallergen avoidance measures have been discussed and provided in written form.  A prescription has been provided for carbinoxamine 4 mg every 8 hours if needed.  A prescription has been provided for [facility], 2 actuations per nostril twice a day. Proper technique has been discussed and demonstrated.  Nasal saline lavage (NeilMed) has been recommended as needed and prior to medicated nasal sprays along with instructions for proper administration.  Chronic sinusitis  Treatment plan as outlined above.  For thick post nasal drainage, nasal congestion, and/or sinus pressure, add guaifenesin [phone] mg (Mucinex) plus/minus pseudoephedrine 60-120 mg  twice daily as needed with adequate hydration as discussed. Pseudoephedrine is only to be used for short-term relief of nasal/sinus congestion. Long-term use is discouraged due to potential side effects.  If this problem persists or progresses despite compliance with treatment plan as outlined above, further evaluation by Dr. Benjamine Mola, otolaryngology, is recommended.  Allergic conjunctivitis  Treatment plan as outlined above for allergic rhinitis.  A prescription has been provided for Pazeo, one drop per eye daily as needed.  I have also recommended eye lubricant drops (i.e., Natural Tears) as needed.   Return in about 4 months (around 07/31/2018), or if symptoms worsen or fail to improve.  Control of Mold Allergen  Mold and fungi can grow on a variety of surfaces provided certain temperature and moisture conditions exist.  Outdoor molds grow on plants, decaying vegetation and soil.  The major outdoor mold, Alternaria and Cladosporium, are found in very high numbers during hot and dry conditions.  Generally, a late Summer - Fall peak is seen for common outdoor fungal spores.  Rain will temporarily lower outdoor mold spore count, but counts rise rapidly when the rainy period ends.   The most important indoor molds are Aspergillus and Penicillium.  Dark, humid and poorly ventilated basements are ideal sites for mold growth.  The next most common sites of mold growth are the bathroom and the kitchen.  Outdoor [employer] 1. Use air conditioning and keep windows closed 2. Avoid exposure to decaying vegetation. 3. Avoid leaf raking. 4. Avoid grain handling. 5. Consider wearing a face mask if working in moldy areas.  Indoor Mold Control 1. Maintain humidity below 50%. 2. Clean washable surfaces with 5% bleach solution. 3. Remove sources e.g. Contaminated carpets.

## 2018-03-31 NOTE — Assessment & Plan Note (Addendum)
   Treatment plan as outlined above.  For thick post nasal drainage, nasal congestion, and/or sinus pressure, add guaifenesin [phone] mg (Mucinex) plus/minus pseudoephedrine 60-120 mg  twice daily as needed with adequate hydration as discussed. Pseudoephedrine is only to be used for short-term relief of nasal/sinus congestion. Long-term use is discouraged due to potential side effects.  If this problem persists or progresses despite compliance with treatment plan as outlined above, further evaluation by Dr. Benjamine Mola, otolaryngology, is recommended.

## 2018-04-01 ENCOUNTER — Other Ambulatory Visit: Payer: Self-pay | Admitting: Allergy and Immunology

## 2018-04-01 ENCOUNTER — Other Ambulatory Visit: Payer: Self-pay | Admitting: *Deleted

## 2018-04-01 MED ORDER — OLOPATADINE HCL 0.2 % OP SOLN: 1.0000 [drp] | OPHTHALMIC | 5 refills | Status: DC

## 2018-04-01 NOTE — Telephone Encounter (Signed)
Contacted pharmacy and unknown of alternative. Please send in [place] to see and they will run medication

## 2018-04-01 NOTE — Telephone Encounter (Signed)
What alternatives does her insurance cover?

## 2018-04-01 NOTE — Telephone Encounter (Signed)
Please advise alternative medication LOV:  03/31/2018

## 2018-04-10 ENCOUNTER — Telehealth: Payer: Self-pay | Admitting: *Deleted

## 2018-04-10 NOTE — Telephone Encounter (Signed)
LVM for pt to return my call- need to know which nasal sprays she has tried and failed. I am working on a PA for her xhance.

## 2018-04-13 ENCOUNTER — Other Ambulatory Visit: Payer: Self-pay | Admitting: Allergy

## 2018-04-13 ENCOUNTER — Telehealth: Payer: Self-pay | Admitting: Allergy

## 2018-04-13 MED ORDER — TRIAMCINOLONE ACETONIDE 0.1 % MT PSTE: 1.0000 "application " | PASTE | Freq: Two times a day (BID) | OROMUCOSAL | 1 refills | Status: DC | PRN

## 2018-04-13 NOTE — Telephone Encounter (Signed)
Please send in a prescription for triamcinolone 0.1% dental paste sparingly to oral ulcers twice daily as needed.  Please submit prior authorization for Xhance.  Thanks.

## 2018-04-13 NOTE — Telephone Encounter (Signed)
Not likely, especially since, by blowing into the applicator, the soft palate is closed off so with the medication is kept in the nose and sinuses rather than going into the mouth. That is the best allergy eyedrop that her insurance will pay for. I would recommend using Natural Tears eye drops as needed as well.

## 2018-04-13 NOTE — Telephone Encounter (Signed)
Pt has used otc flonase for 5 years and rhinacort otc off and on for 1 year. Although pt did receive 2 free boxes in the mail and has been using the xhance, carbinoxamine, and pazeo. She is complaining of painful ulcers in her mouth, white on top of her tongue (but doesn't know if that is just how it always looks), states her throat and tongue feel irritated and sore- she has had thrush before and does not think this is thrush. Please advise.   She is using pazeo and her eyes are not any better. Her left eye is swollen and itchy and irritated. Please advise.

## 2018-04-13 NOTE — Telephone Encounter (Signed)
Any commendations on the eye drop?

## 2018-04-13 NOTE — Telephone Encounter (Signed)
Could this be a reaction to the xhance?

## 2018-04-13 NOTE — Telephone Encounter (Signed)
Logan took care of

## 2018-04-13 NOTE — Telephone Encounter (Signed)
Pt made aware

## 2018-04-15 DIAGNOSIS — K317 Polyp of stomach and duodenum: Secondary | ICD-10-CM | POA: Diagnosis not present

## 2018-04-15 DIAGNOSIS — R1013 Epigastric pain: Secondary | ICD-10-CM | POA: Diagnosis not present

## 2018-04-15 DIAGNOSIS — K219 Gastro-esophageal reflux disease without esophagitis: Secondary | ICD-10-CM | POA: Diagnosis not present

## 2018-04-15 DIAGNOSIS — R1310 Dysphagia, unspecified: Secondary | ICD-10-CM | POA: Diagnosis not present

## 2018-04-17 DIAGNOSIS — F4323 Adjustment disorder with mixed anxiety and depressed mood: Secondary | ICD-10-CM | POA: Diagnosis not present

## 2018-04-28 ENCOUNTER — Telehealth: Payer: Self-pay | Admitting: Certified Nurse Midwife

## 2018-04-28 NOTE — Telephone Encounter (Signed)
Reviewed with Melvia Heaps, CNM. Call returned to patient. Patient would like to wean off of estrace 1mg  po daily.   Instructed patient to reduce to 1/2 tab daily for 1 wk, if doing ok can continue 1/2 tab daily for 1 more wk, then reduce to 1/2 tab q other day for 1 week then stop. Continue to monitor vasomotor symptoms, call with any concerns.   Routing to provider for final review. Patient is agreeable to disposition. Will close encounter.

## 2018-04-28 NOTE — Telephone Encounter (Signed)
Patient has previously discussed going off of estradiol and is ready to try. Would like to speak with a nurse regarding how to wean off.

## 2018-04-30 DIAGNOSIS — H10013 Acute follicular conjunctivitis, bilateral: Secondary | ICD-10-CM | POA: Diagnosis not present

## 2018-05-06 DIAGNOSIS — R197 Diarrhea, unspecified: Secondary | ICD-10-CM | POA: Diagnosis not present

## 2018-05-06 DIAGNOSIS — Z6822 Body mass index (BMI) 22.0-22.9, adult: Secondary | ICD-10-CM | POA: Diagnosis not present

## 2018-05-06 DIAGNOSIS — R1084 Generalized abdominal pain: Secondary | ICD-10-CM | POA: Diagnosis not present

## 2018-05-08 ENCOUNTER — Ambulatory Visit
Admission: RE | Admit: 2018-05-08 | Discharge: 2018-05-08 | Disposition: A | Discharge status: Home / Self Care | Payer: BLUE CROSS/BLUE SHIELD | Source: Ambulatory Visit | Attending: Internal Medicine | Admitting: Internal Medicine

## 2018-05-08 ENCOUNTER — Other Ambulatory Visit: Payer: Self-pay | Admitting: Internal Medicine

## 2018-05-08 DIAGNOSIS — N281 Cyst of kidney, acquired: Secondary | ICD-10-CM | POA: Diagnosis not present

## 2018-05-08 DIAGNOSIS — R109 Unspecified abdominal pain: Secondary | ICD-10-CM

## 2018-05-08 DIAGNOSIS — K5730 Diverticulosis of large intestine without perforation or abscess without bleeding: Secondary | ICD-10-CM | POA: Diagnosis not present

## 2018-05-11 DIAGNOSIS — R300 Dysuria: Secondary | ICD-10-CM | POA: Diagnosis not present

## 2018-05-15 ENCOUNTER — Telehealth: Payer: Self-pay | Admitting: Certified Nurse Midwife

## 2018-05-15 DIAGNOSIS — N83201 Unspecified ovarian cyst, right side: Secondary | ICD-10-CM

## 2018-05-15 DIAGNOSIS — Z90710 Acquired absence of both cervix and uterus: Secondary | ICD-10-CM

## 2018-05-15 DIAGNOSIS — Z780 Asymptomatic menopausal state: Secondary | ICD-10-CM

## 2018-05-15 NOTE — Telephone Encounter (Signed)
Spoke with patient. She had a CT Scan on 05/08/18 ordered by her PCP Dr. Joylene Draft due to R Flank Pain.   Noted  17 mm right adnexal simple appearing cyst without worrisome features. CT Scans reviewed in Epic.   This was not noted on the CT scan from one year ago.  Pt states pain is intermittent and sharp. But it has improved since she saw her PCP. She is S/p Hysterectomy, "many years ago" per patient, done in [place], [place].  Advised patient if any worsening in symptoms before appointment to seek emergency care. She verbalized understanding importance of emergency care if symptoms worsen. Denies current fevers, nausea, vomiting, bowel or bladder changes.   Reviewed with Dr. Talbert Nan, okay for ultrasound on Tuesday and consult.   Ultrasound scheduled for 05/19/18 and consult with Dr. Talbert Nan. Pt agreeable to plan and understands all instrucitons.  Routing to Dr. Talbert Nan and Melvia Heaps CNM.  Will close encounter.

## 2018-05-15 NOTE — Telephone Encounter (Signed)
Patient recently had CT scan at [place], having pain on her right side. CT scan shows cyst on right fallopian tube. Urologist told to follow up with gynecologist.

## 2018-05-18 NOTE — Progress Notes (Signed)
GYNECOLOGY  VISIT   HPI: 62 y.o.   Married White or Caucasian Not Hispanic or Latino  female   [phone] with Patient's last menstrual period was 07/23/2003.   here for consult following PUS.    The patient c/o intermittent pain in her right mid abdomen, some lower abdominal pressure, lower back discomfort. Her mid abdominal pain has been worse in the last 3 weeks. She goes from diarrhea to constipation.  Can have shooting pain in her breast, in her back.  The breast pain is a random shooting pain in the upper outer quadrant of either breast, quick and sharp. She doesn't drink caffeine.  She c/o urinary frequency, bad the last 3 week. She was just evaluated by urology, negative.  Her husband has ALS, she has to lift him some (has a lift), no pain with lifting.  CT scan with 17 mm cyst in the right adnexa. Prior hysterectomy  GYNECOLOGIC HISTORY: Patient's last menstrual period was 07/23/2003. Contraception:Hysterectomy Menopausal hormone therapy: None        OB History    Gravida  3   Para  2   Term  2   Preterm      AB  1   Living  2     SAB  1   TAB      Ectopic      Multiple      Live Births  2              Patient Active Problem List   Diagnosis Date Noted  . Chronic sinusitis 03/31/2018  . Allergic conjunctivitis 03/31/2018  . Perennial allergic rhinitis with a probable nonallergic component 12/24/2016  . Migraine 12/24/2016    Past Medical History:  Diagnosis Date  . Diverticulitis   . Fibroid   . IBS (irritable bowel syndrome)   . Interstitial cystitis   . Menorrhagia   . Migraines   . Osteoporosis    on reclast  . Thyroid nodule    u/s 2011 neg    Past Surgical History:  Procedure Laterality Date  . BREAST SURGERY  [id number]   fibroadenoma in rt & left breast  . CHOLECYSTECTOMY    . TONSILLECTOMY    . VAGINAL HYSTERECTOMY  2005    Current Outpatient Medications  Medication Sig Dispense Refill  . Acetaminophen (TYLENOL PO) Take by  mouth as needed.    . Calcium Carbonate-Vitamin D (CALCIUM + D PO) Take by mouth daily.    Marland Kitchen FLUoxetine (PROZAC) 10 MG capsule Take by mouth daily.  11  . GARLIC PO Take by mouth.    . moxifloxacin (VIGAMOX) 0.5 % ophthalmic solution INSTILL 1 DROP IN AFFECTED EYE OR EYES THREE TIMES A DAY FOR 7 DAYS  1  . Multiple Vitamins-Minerals (MULTIVITAMIN PO) Take by mouth daily.    . Probiotic Product (PROBIOTIC PO) Take by mouth daily.    . SUMAtriptan (IMITREX) 50 MG tablet Take 1 tablet (50 mg total) by mouth once. May repeat in 2 hours if headache persists or recurs. 18 tablet 2  . cetirizine (ZYRTEC) 10 MG tablet Take 10 mg by mouth as needed for allergies.     No current facility-administered medications for this visit.      ALLERGIES: Singulair [montelukast] and Sulfa antibiotics  Family History  Problem Relation Age of Onset  . Hypertension Mother   . Osteoporosis Mother   . Stroke Mother   . Thyroid disease Mother   . Cancer Father  bladder  . Hypertension Father   . Stroke Father   . Hypertension Sister   . Hypertension Brother   . Other Brother        Factor 3  . Diabetes Maternal Grandmother     Social History   Socioeconomic History  . Marital status: Married    Spouse name: Not on file  . Number of children: Not on file  . Years of education: Not on file  . Highest education level: Not on file  Occupational History  . Not on file  Social Needs  . Financial resource strain: Not on file  . Food insecurity:    Worry: Not on file    Inability: Not on file  . Transportation needs:    Medical: Not on file    Non-medical: Not on file  Tobacco Use  . Smoking status: Never Smoker  . Smokeless tobacco: Never Used  Substance and Sexual Activity  . Alcohol use: No  . Drug use: No  . Sexual activity: Yes    Partners: Male    Birth control/protection: Surgical    Comment: hysterectomy  Lifestyle  . Physical activity:    Days per week: Not on file     Minutes per session: Not on file  . Stress: Not on file  Relationships  . Social connections:    Talks on phone: Not on file    Gets together: Not on file    Attends religious service: Not on file    Active member of club or organization: Not on file    Attends meetings of clubs or organizations: Not on file    Relationship status: Not on file  . Intimate partner violence:    Fear of current or ex partner: Not on file    Emotionally abused: Not on file    Physically abused: Not on file    Forced sexual activity: Not on file  Other Topics Concern  . Not on file  Social History Narrative  . Not on file    Review of Systems  Constitutional: Negative.   HENT: Negative.   Eyes: Negative.   Respiratory: Negative.   Cardiovascular: Negative.   Gastrointestinal: Positive for abdominal pain, constipation and diarrhea.  Genitourinary: Positive for frequency and urgency.       Loss of urine with sneeze or cough  Musculoskeletal: Negative.        Breast pain  Skin: Negative.   Neurological: Negative.   Endo/Heme/Allergies: Negative.   Psychiatric/Behavioral: Negative.     PHYSICAL EXAMINATION:    BP 126/80 (BP Location: Right Arm, Patient Position: Sitting, Cuff Size: Normal)   Pulse 72   Wt 130 lb 9.6 oz (59.2 kg)   LMP 07/23/2003   BMI 22.77 kg/m     General appearance: alert, cooperative and appears stated age Neck: no adenopathy, supple, symmetrical, trachea midline and thyroid normal to inspection and palpation Breasts: normal appearance, no masses or tenderness Abdomen: soft, mildly tender in the right mid to right lower abdomen. No rebound, no guarding. Less tender with palpation over tensed abdominal wall; non distended, no masses,  no organomegaly  Ultrasound images reviewed with the patient. The cyst that was seen on CT is next to her right ovary. C/W paratubal or paraovarian cyst. Simple and benign appearing.    ASSESSMENT Abdominal pain, suspect from GI source.  She goes from diarrhea to constipation,. Negative imaging Right paraovarian cyst, benign, not c/w the cause of her pain, incidental finding. No f/u needed  Bilateral shooting breast pain    PLAN Reassured that the adnexal cyst is not worrisome F/u with primary for GI symptoms and abdominal pain We discussed having a well fitting bra, avoiding caffeine (she already does), using ice, and trying evening primrose oil and vit E She is set up for a 3D mammogram, discussed the option of adding ultrasound and she declined    An After Visit Summary was printed and given to the patient.  ~15 minutes face to face time of which over 50% was spent in counseling.   CC: Dr Joylene Draft, Evalee Mutton, CNM

## 2018-05-19 ENCOUNTER — Ambulatory Visit (INDEPENDENT_AMBULATORY_CARE_PROVIDER_SITE_OTHER): Payer: BLUE CROSS/BLUE SHIELD

## 2018-05-19 ENCOUNTER — Ambulatory Visit: Payer: BLUE CROSS/BLUE SHIELD | Admitting: Obstetrics and Gynecology

## 2018-05-19 ENCOUNTER — Other Ambulatory Visit: Payer: Self-pay

## 2018-05-19 ENCOUNTER — Encounter: Payer: Self-pay | Admitting: Obstetrics and Gynecology

## 2018-05-19 VITALS — BP 126/80 | HR 72 | Wt 130.6 lb

## 2018-05-19 DIAGNOSIS — R198 Other specified symptoms and signs involving the digestive system and abdomen: Secondary | ICD-10-CM

## 2018-05-19 DIAGNOSIS — N644 Mastodynia: Secondary | ICD-10-CM

## 2018-05-19 DIAGNOSIS — R1031 Right lower quadrant pain: Secondary | ICD-10-CM

## 2018-05-19 DIAGNOSIS — N83201 Unspecified ovarian cyst, right side: Secondary | ICD-10-CM | POA: Diagnosis not present

## 2018-05-19 DIAGNOSIS — Z780 Asymptomatic menopausal state: Secondary | ICD-10-CM

## 2018-05-19 DIAGNOSIS — N949 Unspecified condition associated with female genital organs and menstrual cycle: Secondary | ICD-10-CM

## 2018-05-19 DIAGNOSIS — Z90710 Acquired absence of both cervix and uterus: Secondary | ICD-10-CM

## 2018-05-19 NOTE — Patient Instructions (Signed)
To try and decrease your breast pain, you should have a well fitting supportive bra, cut back on caffeine, and use ice or heat as needed. Some women find relief with the supplement evening primrose oil.  

## 2018-05-25 DIAGNOSIS — M810 Age-related osteoporosis without current pathological fracture: Secondary | ICD-10-CM | POA: Diagnosis not present

## 2018-06-08 DIAGNOSIS — F4323 Adjustment disorder with mixed anxiety and depressed mood: Secondary | ICD-10-CM | POA: Diagnosis not present

## 2018-06-22 ENCOUNTER — Other Ambulatory Visit: Payer: Self-pay | Admitting: Certified Nurse Midwife

## 2018-06-22 DIAGNOSIS — Z1231 Encounter for screening mammogram for malignant neoplasm of breast: Secondary | ICD-10-CM

## 2018-06-26 ENCOUNTER — Ambulatory Visit
Admission: RE | Admit: 2018-06-26 | Discharge: 2018-06-26 | Disposition: A | Discharge status: Home / Self Care | Payer: BLUE CROSS/BLUE SHIELD | Source: Ambulatory Visit | Attending: Certified Nurse Midwife | Admitting: Certified Nurse Midwife

## 2018-06-26 DIAGNOSIS — Z1231 Encounter for screening mammogram for malignant neoplasm of breast: Secondary | ICD-10-CM

## 2018-06-29 ENCOUNTER — Telehealth: Payer: Self-pay | Admitting: Certified Nurse Midwife

## 2018-06-30 DIAGNOSIS — D3131 Benign neoplasm of right choroid: Secondary | ICD-10-CM | POA: Diagnosis not present

## 2018-06-30 DIAGNOSIS — D3132 Benign neoplasm of left choroid: Secondary | ICD-10-CM | POA: Diagnosis not present

## 2018-06-30 DIAGNOSIS — H2513 Age-related nuclear cataract, bilateral: Secondary | ICD-10-CM | POA: Diagnosis not present

## 2018-08-02 DIAGNOSIS — J0140 Acute pansinusitis, unspecified: Secondary | ICD-10-CM | POA: Diagnosis not present

## 2018-08-03 ENCOUNTER — Ambulatory Visit: Payer: BLUE CROSS/BLUE SHIELD | Admitting: Allergy and Immunology

## 2018-08-13 DIAGNOSIS — Z1231 Encounter for screening mammogram for malignant neoplasm of breast: Secondary | ICD-10-CM | POA: Diagnosis not present

## 2018-08-13 DIAGNOSIS — Z0000 Encounter for general adult medical examination without abnormal findings: Secondary | ICD-10-CM | POA: Diagnosis not present

## 2018-08-13 DIAGNOSIS — F329 Major depressive disorder, single episode, unspecified: Secondary | ICD-10-CM | POA: Diagnosis not present

## 2018-08-13 DIAGNOSIS — G43109 Migraine with aura, not intractable, without status migrainosus: Secondary | ICD-10-CM | POA: Diagnosis not present

## 2018-08-13 DIAGNOSIS — Z0001 Encounter for general adult medical examination with abnormal findings: Secondary | ICD-10-CM | POA: Diagnosis not present

## 2018-08-18 ENCOUNTER — Ambulatory Visit: Payer: BLUE CROSS/BLUE SHIELD | Admitting: Certified Nurse Midwife

## 2018-09-11 DIAGNOSIS — K5792 Diverticulitis of intestine, part unspecified, without perforation or abscess without bleeding: Secondary | ICD-10-CM | POA: Diagnosis not present

## 2018-09-18 DIAGNOSIS — R1032 Left lower quadrant pain: Secondary | ICD-10-CM | POA: Diagnosis not present

## 2018-09-18 DIAGNOSIS — R194 Change in bowel habit: Secondary | ICD-10-CM | POA: Diagnosis not present

## 2018-09-18 DIAGNOSIS — K5790 Diverticulosis of intestine, part unspecified, without perforation or abscess without bleeding: Secondary | ICD-10-CM | POA: Diagnosis not present

## 2018-09-21 DIAGNOSIS — R194 Change in bowel habit: Secondary | ICD-10-CM | POA: Diagnosis not present

## 2018-09-21 DIAGNOSIS — R1032 Left lower quadrant pain: Secondary | ICD-10-CM | POA: Diagnosis not present

## 2019-07-26 ENCOUNTER — Other Ambulatory Visit: Payer: Self-pay

## 2019-07-26 ENCOUNTER — Encounter (HOSPITAL_COMMUNITY): Payer: Self-pay | Admitting: Emergency Medicine

## 2019-07-26 ENCOUNTER — Emergency Department (HOSPITAL_COMMUNITY): Payer: 59

## 2019-07-26 ENCOUNTER — Emergency Department (HOSPITAL_COMMUNITY)
Admission: EM | Admit: 2019-07-26 | Discharge: 2019-07-27 | Disposition: A | Discharge status: Home / Self Care | Payer: 59 | Attending: Emergency Medicine | Admitting: Emergency Medicine

## 2019-07-26 DIAGNOSIS — N390 Urinary tract infection, site not specified: Secondary | ICD-10-CM | POA: Insufficient documentation

## 2019-07-26 DIAGNOSIS — Z20822 Contact with and (suspected) exposure to covid-19: Secondary | ICD-10-CM | POA: Insufficient documentation

## 2019-07-26 DIAGNOSIS — J90 Pleural effusion, not elsewhere classified: Secondary | ICD-10-CM | POA: Insufficient documentation

## 2019-07-26 DIAGNOSIS — R0600 Dyspnea, unspecified: Secondary | ICD-10-CM | POA: Diagnosis present

## 2019-07-26 DIAGNOSIS — Z79899 Other long term (current) drug therapy: Secondary | ICD-10-CM | POA: Insufficient documentation

## 2019-07-26 NOTE — ED Triage Notes (Signed)
Pt reports that she had an emergency appendectomy on 12/25, d/c on 12/31, c/o shortness of breath and bilateral lower leg swelling x 2 days. Sent to r/o PE.

## 2019-07-27 ENCOUNTER — Emergency Department (HOSPITAL_COMMUNITY): Payer: 59

## 2019-07-27 LAB — BRAIN NATRIURETIC PEPTIDE: B Natriuretic Peptide: 54.1 pg/mL (ref 0.0–100.0)

## 2019-07-27 LAB — URINALYSIS, ROUTINE W REFLEX MICROSCOPIC
Bilirubin Urine: NEGATIVE
Glucose, UA: NEGATIVE mg/dL
Hgb urine dipstick: NEGATIVE
Ketones, ur: NEGATIVE mg/dL
Nitrite: POSITIVE — AB
Protein, ur: NEGATIVE mg/dL
Specific Gravity, Urine: 1.003 — ABNORMAL LOW (ref 1.005–1.030)
pH: 7 (ref 5.0–8.0)

## 2019-07-27 LAB — CBC WITH DIFFERENTIAL/PLATELET
Abs Immature Granulocytes: 0.33 10*3/uL — ABNORMAL HIGH (ref 0.00–0.07)
Basophils Absolute: 0.1 10*3/uL (ref 0.0–0.1)
Basophils Relative: 1 %
Eosinophils Absolute: 0.1 10*3/uL (ref 0.0–0.5)
Eosinophils Relative: 1 %
HCT: 38.6 % (ref 36.0–46.0)
Hemoglobin: 12.3 g/dL (ref 12.0–15.0)
Immature Granulocytes: 3 %
Lymphocytes Relative: 14 %
Lymphs Abs: 1.8 10*3/uL (ref 0.7–4.0)
MCH: 30.1 pg (ref 26.0–34.0)
MCHC: 31.9 g/dL (ref 30.0–36.0)
MCV: 94.4 fL (ref 80.0–100.0)
Monocytes Absolute: 1.7 10*3/uL — ABNORMAL HIGH (ref 0.1–1.0)
Monocytes Relative: 13 %
Neutro Abs: 9.3 10*3/uL — ABNORMAL HIGH (ref 1.7–7.7)
Neutrophils Relative %: 68 %
Platelets: 528 10*3/uL — ABNORMAL HIGH (ref 150–400)
RBC: 4.09 MIL/uL (ref 3.87–5.11)
RDW: 15 % (ref 11.5–15.5)
WBC: 13.2 10*3/uL — ABNORMAL HIGH (ref 4.0–10.5)
nRBC: 0 % (ref 0.0–0.2)

## 2019-07-27 LAB — COMPREHENSIVE METABOLIC PANEL
ALT: 24 U/L (ref 0–44)
AST: 36 U/L (ref 15–41)
Albumin: 2.5 g/dL — ABNORMAL LOW (ref 3.5–5.0)
Alkaline Phosphatase: 96 U/L (ref 38–126)
Anion gap: 13 (ref 5–15)
BUN: 6 mg/dL — ABNORMAL LOW (ref 8–23)
CO2: 22 mmol/L (ref 22–32)
Calcium: 8.7 mg/dL — ABNORMAL LOW (ref 8.9–10.3)
Chloride: 101 mmol/L (ref 98–111)
Creatinine, Ser: 0.39 mg/dL — ABNORMAL LOW (ref 0.44–1.00)
GFR calc Af Amer: >60 mL/min (ref >60)
GFR calc non Af Amer: >60 mL/min (ref >60)
Glucose, Bld: 108 mg/dL — ABNORMAL HIGH (ref 70–99)
Potassium: 4.6 mmol/L (ref 3.5–5.1)
Sodium: 136 mmol/L (ref 135–145)
Total Bilirubin: 0.4 mg/dL (ref 0.3–1.2)
Total Protein: 5.9 g/dL — ABNORMAL LOW (ref 6.5–8.1)

## 2019-07-27 LAB — SARS CORONAVIRUS 2 (TAT 6-24 HRS): SARS Coronavirus 2: NEGATIVE

## 2019-07-27 MED ORDER — CEPHALEXIN 500 MG PO CAPS: 500.0000 mg | ORAL_CAPSULE | Freq: Two times a day (BID) | ORAL | 0 refills | Status: AC

## 2019-07-27 MED ORDER — FUROSEMIDE 20 MG PO TABS: 20.0000 mg | ORAL_TABLET | Freq: Every day | ORAL | 0 refills | Status: DC

## 2019-07-27 MED ORDER — FLUCONAZOLE 100 MG PO TABS: 100.0000 mg | ORAL_TABLET | Freq: Every day | ORAL | 0 refills | Status: AC

## 2019-07-27 MED ORDER — FUROSEMIDE 20 MG PO TABS
20.0000 mg | ORAL_TABLET | Freq: Once | ORAL | Status: AC
  Administered 2019-07-27: 20 mg via ORAL
  Filled 2019-07-27: qty 1

## 2019-07-27 MED ORDER — FLUCONAZOLE 100 MG PO TABS
100.0000 mg | ORAL_TABLET | Freq: Once | ORAL | Status: AC
  Administered 2019-07-27: 100 mg via ORAL
  Filled 2019-07-27 (×2): qty 1

## 2019-07-27 MED ORDER — CEPHALEXIN 250 MG PO CAPS
500.0000 mg | ORAL_CAPSULE | Freq: Once | ORAL | Status: AC
  Administered 2019-07-27: 500 mg via ORAL
  Filled 2019-07-27: qty 2

## 2019-07-27 MED ORDER — IOHEXOL 350 MG/ML SOLN
100.0000 mL | Freq: Once | INTRAVENOUS | Status: AC | PRN
  Administered 2019-07-27: 100 mL via INTRAVENOUS

## 2019-07-27 NOTE — ED Provider Notes (Signed)
[facility] EMERGENCY DEPARTMENT Provider Note   CSN: [id number] Arrival date & time: 07/26/19  1726     History Chief Complaint  Patient presents with  . Shortness of Breath    Stacey Calhoun is a 64 y.o. female.  HPI    Patient presents with dyspnea, swelling. Patient states that she is generally well, though she did have notable recent hospitalization.  Notably, the patient had perforated appendicitis, requiring surgery, admission, with discharge last week.  Surgery was performed at a different healthcare facility. She notes that essentially since discharge, now with more difficulty she has had persistent dyspnea.  There is associated bilateral lower extremity swelling No fever. There are some lower chest wall discomfort, but no other chest pain, no abdominal pain, no nausea, vomiting, diarrhea. Patient has a history of a factor V Leiden mutation as well.  Past Medical History:  Diagnosis Date  . Diverticulitis   . Fibroid   . IBS (irritable bowel syndrome)   . Interstitial cystitis   . Menorrhagia   . Migraines   . Osteoporosis    on reclast  . Thyroid nodule    u/s 2011 neg    Patient Active Problem List   Diagnosis Date Noted  . Chronic sinusitis 03/31/2018  . Allergic conjunctivitis 03/31/2018  . Perennial allergic rhinitis with a probable nonallergic component 12/24/2016  . Migraine 12/24/2016    Past Surgical History:  Procedure Laterality Date  . BREAST SURGERY  [id number]   fibroadenoma in rt & left breast  . CHOLECYSTECTOMY    . TONSILLECTOMY    . VAGINAL HYSTERECTOMY  2005     OB History    Gravida  3   Para  2   Term  2   Preterm      AB  1   Living  2     SAB  1   TAB      Ectopic      Multiple      Live Births  2           Family History  Problem Relation Age of Onset  . Hypertension Mother   . Osteoporosis Mother   . Stroke Mother   . Thyroid disease Mother   . Cancer Father        bladder    . Hypertension Father   . Stroke Father   . Hypertension Sister   . Hypertension Brother   . Other Brother        Factor 3  . Diabetes Maternal Grandmother     Social History   Tobacco Use  . Smoking status: Never Smoker  . Smokeless tobacco: Never Used  Substance Use Topics  . Alcohol use: No  . Drug use: No    Home Medications Prior to Admission medications   Medication Sig Start Date End Date Taking? Authorizing Provider  Acetaminophen (TYLENOL PO) Take by mouth as needed.    [provider]  Calcium Carbonate-Vitamin D (CALCIUM + D PO) Take by mouth daily.    [provider]  cetirizine (ZYRTEC) 10 MG tablet Take 10 mg by mouth as needed for allergies.    [provider]  FLUoxetine (PROZAC) 10 MG capsule Take by mouth daily. 08/04/17   [provider]  GARLIC PO Take by mouth.    [provider]  moxifloxacin (VIGAMOX) 0.5 % ophthalmic solution INSTILL 1 DROP IN AFFECTED EYE OR EYES THREE TIMES A DAY FOR 7 DAYS 03/26/18  [provider]  Multiple Vitamins-Minerals (MULTIVITAMIN PO) Take by mouth daily.    [provider]  Probiotic Product (PROBIOTIC PO) Take by mouth daily.    [provider]  SUMAtriptan (IMITREX) 50 MG tablet Take 1 tablet (50 mg total) by mouth once. May repeat in 2 hours if headache persists or recurs. 08/01/15   Regina Eck, CNM    Allergies    Singulair [montelukast] and Sulfa antibiotics  Review of Systems   Review of Systems  Constitutional:       Per HPI, otherwise negative  HENT:       Per HPI, otherwise negative  Respiratory:       Per HPI, otherwise negative  Cardiovascular:       Per HPI, otherwise negative  Gastrointestinal: Negative for vomiting.  Endocrine:       Negative aside from HPI  Genitourinary:       Neg aside from HPI   Musculoskeletal:       Per HPI, otherwise negative  Skin: Negative.   Neurological: Negative for syncope.    Physical  Exam Updated Vital Signs BP (!) 144/78   Pulse 82   Temp 98.3 F (36.8 C) (Oral)   Resp (!) 22   LMP 07/23/2003   SpO2 98%   Physical Exam Vitals and nursing note reviewed.  Constitutional:      General: She is not in acute distress.    Appearance: She is well-developed.  HENT:     Head: Normocephalic and atraumatic.  Eyes:     Conjunctiva/sclera: Conjunctivae normal.  Cardiovascular:     Rate and Rhythm: Normal rate and regular rhythm.  Pulmonary:     Effort: Pulmonary effort is normal. No respiratory distress.     Breath sounds: Normal breath sounds. No stridor.  Abdominal:     General: There is no distension.  Musculoskeletal:     Right lower leg: No tenderness. Edema present.     Left lower leg: No tenderness. Edema present.  Skin:    General: Skin is warm and dry.  Neurological:     Mental Status: She is alert and oriented to person, place, and time.     Cranial Nerves: No cranial nerve deficit.     ED Results / Procedures / Treatments   Labs (all labs ordered are listed, but only abnormal results are displayed) Labs Reviewed  COMPREHENSIVE METABOLIC PANEL - Abnormal; Notable for the following components:      Result Value   Glucose, Bld 108 (*)    BUN 6 (*)    Creatinine, Ser 0.39 (*)    Calcium 8.7 (*)    Total Protein 5.9 (*)    Albumin 2.5 (*)    All other components within normal limits  CBC WITH DIFFERENTIAL/PLATELET - Abnormal; Notable for the following components:   WBC 13.2 (*)    Platelets 528 (*)    Neutro Abs 9.3 (*)    Monocytes Absolute 1.7 (*)    Abs Immature Granulocytes 0.33 (*)    All other components within normal limits  URINALYSIS, ROUTINE W REFLEX MICROSCOPIC - Abnormal; Notable for the following components:   Color, Urine AMBER (*)    APPearance HAZY (*)    Specific Gravity, Urine 1.003 (*)    Nitrite POSITIVE (*)    Leukocytes,Ua SMALL (*)    Bacteria, UA RARE (*)    Non Squamous Epithelial 0-5 (*)    All other components  within normal limits  SARS  CORONAVIRUS 2 (TAT 6-24 HRS)  BRAIN NATRIURETIC PEPTIDE    EKG EKG Interpretation  Date/Time:  Monday July 26 2019 18:37:23 EST Ventricular Rate:  97 PR Interval:  112 QRS Duration: 76 QT Interval:  330 QTC Calculation: 419 R Axis:   79 Text Interpretation: Sinus rhythm with occasional Premature ventricular complexes Otherwise normal ECG When compared with ECG of EARLIER SAME DATE Premature ventricular complexes are no longer present Confirmed by Delora Fuel ([id number]) on 07/27/2019 1:26:28 AM   Radiology DG Chest 2 View  Result Date: 07/26/2019 CLINICAL DATA:  Shortness of breath. EXAM: CHEST - 2 VIEW COMPARISON:  None. FINDINGS: Heart size and pulmonary vascularity are normal. There are small to moderate bilateral pleural effusions. No consolidative infiltrates. Slight compressive atelectasis at the lung bases. Bones are normal. IMPRESSION: 1. Small to moderate bilateral pleural effusions with slight compressive atelectasis at the lung bases. No consolidative infiltrates. 2. Heart size is normal. Electronically Signed   By: Lorriane Shire M.D.   On: 07/26/2019 19:06   CT Angio Chest PE W/Cm &/Or Wo Cm  Result Date: 07/27/2019 CLINICAL DATA:  Shortness of breath and leg swelling for the past 2 days. Recent appendectomy on Christmas day. EXAM: CT ANGIOGRAPHY CHEST WITH CONTRAST TECHNIQUE: Multidetector CT imaging of the chest was performed using the standard protocol during bolus administration of intravenous contrast. Multiplanar CT image reconstructions and MIPs were obtained to evaluate the vascular anatomy. CONTRAST:  116mL OMNIPAQUE IOHEXOL 350 MG/ML SOLN COMPARISON:  Chest x-ray from yesterday. FINDINGS: Cardiovascular: Satisfactory opacification of the pulmonary arteries to the segmental level. No evidence of pulmonary embolism. Normal heart size. No pericardial effusion. No thoracic aortic aneurysm or dissection. Mediastinum/Nodes: No enlarged mediastinal,  hilar, or axillary lymph nodes. Thyroid gland, trachea, and esophagus demonstrate no significant findings. Lungs/Pleura: Small to moderate bilateral pleural effusions with associated dependent atelectasis in both lower lobes. No consolidation or pneumothorax. No suspicious pulmonary nodule. Upper Abdomen: No acute abnormality. Status post cholecystectomy. Musculoskeletal: 10 x 9 mm nodule in the right breast. No acute or significant osseous findings. Review of the MIP images confirms the above findings. IMPRESSION: 1. No evidence of pulmonary embolism. 2. Small to moderate bilateral pleural effusions with associated dependent atelectasis in both lower lobes. 3. 10 x 9 mm nodule in the right breast. Correlation with mammography is recommended. Electronically Signed   By: Titus Dubin M.D.   On: 07/27/2019 12:45    Procedures Procedures (including critical care time)  Medications Ordered in ED Medications  furosemide (LASIX) tablet 20 mg (has no administration in time range)  cephALEXin (KEFLEX) capsule 500 mg (has no administration in time range)  fluconazole (DIFLUCAN) tablet 100 mg (has no administration in time range)  iohexol (OMNIPAQUE) 350 MG/ML injection 100 mL (100 mLs Intravenous Contrast Given 07/27/19 1222)    ED Course  I have reviewed the triage vital signs and the nursing notes.  Pertinent labs & imaging results that were available during my care of the patient were reviewed by me and considered in my medical decision making (see chart for details).    MDM Rules/Calculators/A&P                      On repeat exam patient in similar condition.  2:55 PM Patient aware of all findings, including reassuring CT results, though again with demonstration of bilateral pleural effusions. We lengthy conversation about all labs, CT imaging, her recent history.  And there are some suspicion for iatrogenic  fluid overload status given recent hospitalization for perforated appendicitis. No  evidence for decompensated heart failure, no evidence for pulmonary embolism, little suspicion for occult pneumonia.  The patient does have UTI, and though she has been taking her levofloxacin, will require new antibiotics for this. We discussed options of admission for increased work of breathing versus discharge. She does not require home oxygen, has a strong preference for discharge, notes that she cares for her husband who has ALS. As I discussed these results with her son after the patient provided consent. Patient discharged in stable condition.  Final Clinical Impression(s) / ED Diagnoses Final diagnoses:  Pleural effusion, bilateral  Lower urinary tract infectious disease    Rx / DC Orders ED Discharge Orders         Ordered    furosemide (LASIX) 20 MG tablet  Daily     07/27/19 1501    fluconazole (DIFLUCAN) 100 MG tablet  Daily     07/27/19 1501    cephALEXin (KEFLEX) 500 MG capsule  2 times daily     07/27/19 1501           Carmin Muskrat, MD 07/27/19 1502

## 2019-07-27 NOTE — ED Notes (Signed)
Patient verbalizes understanding of discharge instructions. Opportunity for questioning and answers were provided. Armband removed by staff, pt discharged from ED in wheelchair.  

## 2019-07-27 NOTE — Discharge Instructions (Signed)
As discussed, today's evaluation has been generally reassuring. There is currently no evidence for pulmonary embolism, nor congestive heart failure. However, you do have bilateral pleural effusions, which may take some time to improve.  Your medication which has been provided, Lasix as require close monitoring.  If you develop new, or concerning changes to be sure to return here for additional evaluation.  Otherwise, follow-up with your physician for appropriate ongoing care.

## 2019-07-27 NOTE — ED Notes (Signed)
Patient transported to CT 

## 2019-08-01 ENCOUNTER — Emergency Department (HOSPITAL_BASED_OUTPATIENT_CLINIC_OR_DEPARTMENT_OTHER)
Admission: EM | Admit: 2019-08-01 | Discharge: 2019-08-01 | Disposition: A | Discharge status: Home / Self Care | Payer: 59 | Attending: Emergency Medicine | Admitting: Emergency Medicine

## 2019-08-01 ENCOUNTER — Emergency Department (HOSPITAL_BASED_OUTPATIENT_CLINIC_OR_DEPARTMENT_OTHER): Payer: 59

## 2019-08-01 ENCOUNTER — Encounter (HOSPITAL_BASED_OUTPATIENT_CLINIC_OR_DEPARTMENT_OTHER): Payer: Self-pay | Admitting: Emergency Medicine

## 2019-08-01 ENCOUNTER — Other Ambulatory Visit: Payer: Self-pay

## 2019-08-01 DIAGNOSIS — R0781 Pleurodynia: Secondary | ICD-10-CM

## 2019-08-01 DIAGNOSIS — Z79899 Other long term (current) drug therapy: Secondary | ICD-10-CM | POA: Insufficient documentation

## 2019-08-01 DIAGNOSIS — R0602 Shortness of breath: Secondary | ICD-10-CM | POA: Diagnosis not present

## 2019-08-01 DIAGNOSIS — Z9049 Acquired absence of other specified parts of digestive tract: Secondary | ICD-10-CM | POA: Insufficient documentation

## 2019-08-01 DIAGNOSIS — J90 Pleural effusion, not elsewhere classified: Secondary | ICD-10-CM | POA: Insufficient documentation

## 2019-08-01 DIAGNOSIS — R071 Chest pain on breathing: Secondary | ICD-10-CM | POA: Diagnosis not present

## 2019-08-01 DIAGNOSIS — Z20822 Contact with and (suspected) exposure to covid-19: Secondary | ICD-10-CM | POA: Diagnosis not present

## 2019-08-01 DIAGNOSIS — R05 Cough: Secondary | ICD-10-CM | POA: Insufficient documentation

## 2019-08-01 DIAGNOSIS — R0789 Other chest pain: Secondary | ICD-10-CM | POA: Diagnosis present

## 2019-08-01 LAB — COMPREHENSIVE METABOLIC PANEL
ALT: 17 U/L (ref 0–44)
AST: 20 U/L (ref 15–41)
Albumin: 3.5 g/dL (ref 3.5–5.0)
Alkaline Phosphatase: 94 U/L (ref 38–126)
Anion gap: 13 (ref 5–15)
BUN: 10 mg/dL (ref 8–23)
CO2: 24 mmol/L (ref 22–32)
Calcium: 9 mg/dL (ref 8.9–10.3)
Chloride: 97 mmol/L — ABNORMAL LOW (ref 98–111)
Creatinine, Ser: 0.59 mg/dL (ref 0.44–1.00)
GFR calc Af Amer: >60 mL/min (ref >60)
GFR calc non Af Amer: >60 mL/min (ref >60)
Glucose, Bld: 125 mg/dL — ABNORMAL HIGH (ref 70–99)
Potassium: 3.6 mmol/L (ref 3.5–5.1)
Sodium: 134 mmol/L — ABNORMAL LOW (ref 135–145)
Total Bilirubin: 0.6 mg/dL (ref 0.3–1.2)
Total Protein: 7.7 g/dL (ref 6.5–8.1)

## 2019-08-01 LAB — URINALYSIS, ROUTINE W REFLEX MICROSCOPIC
Bilirubin Urine: NEGATIVE
Glucose, UA: NEGATIVE mg/dL
Hgb urine dipstick: NEGATIVE
Ketones, ur: NEGATIVE mg/dL
Leukocytes,Ua: NEGATIVE
Nitrite: NEGATIVE
Protein, ur: NEGATIVE mg/dL
Specific Gravity, Urine: <1.005 — ABNORMAL LOW (ref 1.005–1.030)
pH: 5.5 (ref 5.0–8.0)

## 2019-08-01 LAB — CBC WITH DIFFERENTIAL/PLATELET
Abs Immature Granulocytes: 0.11 10*3/uL — ABNORMAL HIGH (ref 0.00–0.07)
Basophils Absolute: 0.1 10*3/uL (ref 0.0–0.1)
Basophils Relative: 1 %
Eosinophils Absolute: 0.1 10*3/uL (ref 0.0–0.5)
Eosinophils Relative: 1 %
HCT: 41.7 % (ref 36.0–46.0)
Hemoglobin: 13.1 g/dL (ref 12.0–15.0)
Immature Granulocytes: 1 %
Lymphocytes Relative: 21 %
Lymphs Abs: 2.4 10*3/uL (ref 0.7–4.0)
MCH: 29.5 pg (ref 26.0–34.0)
MCHC: 31.4 g/dL (ref 30.0–36.0)
MCV: 93.9 fL (ref 80.0–100.0)
Monocytes Absolute: 1.3 10*3/uL — ABNORMAL HIGH (ref 0.1–1.0)
Monocytes Relative: 12 %
Neutro Abs: 7.4 10*3/uL (ref 1.7–7.7)
Neutrophils Relative %: 64 %
Platelets: 636 10*3/uL — ABNORMAL HIGH (ref 150–400)
RBC: 4.44 MIL/uL (ref 3.87–5.11)
RDW: 14.8 % (ref 11.5–15.5)
WBC: 11.4 10*3/uL — ABNORMAL HIGH (ref 4.0–10.5)
nRBC: 0 % (ref 0.0–0.2)

## 2019-08-01 LAB — LIPASE, BLOOD: Lipase: 49 U/L (ref 11–51)

## 2019-08-01 LAB — RESPIRATORY PANEL BY RT PCR (FLU A&B, COVID)
Influenza A by PCR: NEGATIVE
Influenza B by PCR: NEGATIVE
SARS Coronavirus 2 by RT PCR: NEGATIVE

## 2019-08-01 MED ORDER — IOHEXOL 350 MG/ML SOLN
100.0000 mL | Freq: Once | INTRAVENOUS | Status: AC | PRN
  Administered 2019-08-01: 100 mL via INTRAVENOUS

## 2019-08-01 MED ORDER — HYDROMORPHONE HCL 1 MG/ML IJ SOLN
1.0000 mg | Freq: Once | INTRAMUSCULAR | Status: AC
  Administered 2019-08-01: 1 mg via INTRAVENOUS
  Filled 2019-08-01: qty 1

## 2019-08-01 MED ORDER — TRAMADOL HCL 50 MG PO TABS: 50.0000 mg | ORAL_TABLET | Freq: Four times a day (QID) | ORAL | 0 refills | Status: DC | PRN

## 2019-08-01 MED ORDER — PIPERACILLIN-TAZOBACTAM 3.375 G IVPB 30 MIN
3.3750 g | Freq: Once | INTRAVENOUS | Status: AC
  Administered 2019-08-01: 3.375 g via INTRAVENOUS
  Filled 2019-08-01 (×2): qty 50

## 2019-08-01 MED ORDER — KETOROLAC TROMETHAMINE 30 MG/ML IJ SOLN
15.0000 mg | Freq: Once | INTRAMUSCULAR | Status: AC
  Administered 2019-08-01: 15 mg via INTRAVENOUS
  Filled 2019-08-01: qty 1

## 2019-08-01 MED ORDER — AMOXICILLIN-POT CLAVULANATE 875-125 MG PO TABS: 1.0000 | ORAL_TABLET | Freq: Two times a day (BID) | ORAL | 0 refills | Status: DC

## 2019-08-01 MED ORDER — ONDANSETRON HCL 4 MG/2ML IJ SOLN
4.0000 mg | Freq: Once | INTRAMUSCULAR | Status: AC
  Administered 2019-08-01: 4 mg via INTRAVENOUS
  Filled 2019-08-01: qty 2

## 2019-08-01 NOTE — ED Triage Notes (Signed)
Had appy on 12/25, back with sob and lower ext swelling after that.  Today started having sharp pain to Right upper abdomen that radiates into the back with shortness of breath.

## 2019-08-01 NOTE — ED Provider Notes (Addendum)
[facility] Provider Note   CSN: [id number] Arrival date & time: 08/01/19  1724     History Chief Complaint  Patient presents with  . Shortness of Breath    Stacey Calhoun is a 64 y.o. female.  Patient c/o pain to right lower chest/costal margin region for past week. Symptoms acute onset, sharp, moderate, constant, persistent, slowly worse. Radiates around to back. Worse w movements, position changes, non prod cough, and with deep breath. Occasional non prod cough. No sore throat or runny nose. No fever or chills. No known covid + exposure. +associated sob, no nv. No leg pain or swelling, no hx dvt or pe - recent cta chest for same symptoms negative for PE. Denies exertional cp or discomfort.  Patient s/p appendectomy on 12/25. States abd pain resolved, is having normal bms, no infection or spreading redness at incision sites.   The history is provided by the patient.  Shortness of Breath Associated symptoms: no abdominal pain, no fever, no headaches, no neck pain, no rash, no sore throat and no vomiting        Past Medical History:  Diagnosis Date  . Diverticulitis   . Fibroid   . IBS (irritable bowel syndrome)   . Interstitial cystitis   . Menorrhagia   . Migraines   . Osteoporosis    on reclast  . Thyroid nodule    u/s 2011 neg    Patient Active Problem List   Diagnosis Date Noted  . Chronic sinusitis 03/31/2018  . Allergic conjunctivitis 03/31/2018  . Perennial allergic rhinitis with a probable nonallergic component 12/24/2016  . Migraine 12/24/2016    Past Surgical History:  Procedure Laterality Date  . BREAST SURGERY  [id number]   fibroadenoma in rt & left breast  . CHOLECYSTECTOMY    . TONSILLECTOMY    . VAGINAL HYSTERECTOMY  2005     OB History    Gravida  3   Para  2   Term  2   Preterm      AB  1   Living  2     SAB  1   TAB      Ectopic      Multiple      Live Births  2           Family  History  Problem Relation Age of Onset  . Hypertension Mother   . Osteoporosis Mother   . Stroke Mother   . Thyroid disease Mother   . Cancer Father        bladder  . Hypertension Father   . Stroke Father   . Hypertension Sister   . Hypertension Brother   . Other Brother        Factor 3  . Diabetes Maternal Grandmother     Social History   Tobacco Use  . Smoking status: Never Smoker  . Smokeless tobacco: Never Used  Substance Use Topics  . Alcohol use: No  . Drug use: No    Home Medications Prior to Admission medications   Medication Sig Start Date End Date Taking? Authorizing Provider  Acetaminophen (TYLENOL PO) Take by mouth as needed.    [provider]  Calcium Carbonate-Vitamin D (CALCIUM + D PO) Take by mouth daily.    [provider]  cephALEXin (KEFLEX) 500 MG capsule Take 1 capsule (500 mg total) by mouth 2 (two) times daily for 5 days. 07/27/19 08/01/19  Carmin Muskrat, MD  cetirizine (ZYRTEC) 10  MG tablet Take 10 mg by mouth as needed for allergies.    [provider]  fluconazole (DIFLUCAN) 100 MG tablet Take 1 tablet (100 mg total) by mouth daily for 7 days. 07/27/19 08/03/19  Carmin Muskrat, MD  FLUoxetine (PROZAC) 10 MG capsule Take by mouth daily. 08/04/17   [provider]  furosemide (LASIX) 20 MG tablet Take 1 tablet (20 mg total) by mouth daily for 4 days. 07/28/19 08/01/19  Carmin Muskrat, MD  GARLIC PO Take by mouth.    [provider]  moxifloxacin (VIGAMOX) 0.5 % ophthalmic solution INSTILL 1 DROP IN AFFECTED EYE OR EYES THREE TIMES A DAY FOR 7 DAYS 03/26/18   [provider]  Multiple Vitamins-Minerals (MULTIVITAMIN PO) Take by mouth daily.    [provider]  Probiotic Product (PROBIOTIC PO) Take by mouth daily.    [provider]  SUMAtriptan (IMITREX) 50 MG tablet Take 1 tablet (50 mg total) by mouth once. May repeat in 2 hours if headache persists or recurs. 08/01/15   Regina Eck, CNM    Allergies    Singulair [montelukast] and Sulfa antibiotics  Review of Systems   Review of Systems  Constitutional: Negative for fever.  HENT: Negative for sore throat.   Eyes: Negative for redness.  Respiratory: Positive for shortness of breath.   Cardiovascular: Negative for leg swelling.  Gastrointestinal: Negative for abdominal pain, diarrhea and vomiting.  Genitourinary: Negative for flank pain.  Musculoskeletal: Negative for neck pain.  Skin: Negative for rash.  Neurological: Negative for headaches.  Hematological: Does not bruise/bleed easily.  Psychiatric/Behavioral: Negative for confusion.    Physical Exam Updated Vital Signs BP (!) 141/84 (BP Location: Right Arm)   Pulse (!) 107   Temp 99.2 F (37.3 C) (Oral)   Resp (!) 40   Ht 1.613 m (5' 3.5")   Wt 58.1 kg   LMP 07/23/2003   SpO2 99%   BMI 22.32 kg/m   Physical Exam Vitals and nursing note reviewed.  Constitutional:      Appearance: Normal appearance. She is well-developed.  HENT:     Head: Atraumatic.     Nose: Nose normal.     Mouth/Throat:     Mouth: Mucous membranes are moist.  Eyes:     General: No scleral icterus.    Conjunctiva/sclera: Conjunctivae normal.  Neck:     Trachea: No tracheal deviation.  Cardiovascular:     Rate and Rhythm: Normal rate and regular rhythm.     Pulses: Normal pulses.     Heart sounds: Normal heart sounds. No murmur. No friction rub. No gallop.   Pulmonary:     Effort: Pulmonary effort is normal. No respiratory distress.     Breath sounds: Normal breath sounds.     Comments: Right lower chest tenderness.  Chest:     Chest wall: Tenderness present.  Abdominal:     General: Bowel sounds are normal. There is no distension.     Palpations: Abdomen is soft.     Tenderness: There is abdominal tenderness. There is no guarding.     Comments: ruq tenderness.   Genitourinary:    Comments: No cva tenderness.  Musculoskeletal:        General: No  swelling or tenderness.     Cervical back: Normal range of motion and neck supple. No rigidity. No muscular tenderness.     Right lower leg: No edema.     Left lower leg: No edema.  Skin:  General: Skin is warm and dry.     Findings: No rash.     Comments: No skin lesions/rash in area of pain.   Neurological:     Mental Status: She is alert.     Comments: Alert, speech normal.   Psychiatric:        Mood and Affect: Mood normal.     ED Results / Procedures / Treatments   Labs (all labs ordered are listed, but only abnormal results are displayed) Results for orders placed or performed during the hospital encounter of 08/01/19  Respiratory Panel by RT PCR (Flu A&B, Covid) - Nasopharyngeal Swab   Specimen: Nasopharyngeal Swab  Result Value Ref Range   SARS Coronavirus 2 by RT PCR NEGATIVE NEGATIVE   Influenza A by PCR NEGATIVE NEGATIVE   Influenza B by PCR NEGATIVE NEGATIVE  CBC with Differential  Result Value Ref Range   WBC 11.4 (H) 4.0 - 10.5 K/uL   RBC 4.44 3.87 - 5.11 MIL/uL   Hemoglobin 13.1 12.0 - 15.0 g/dL   HCT 41.7 36.0 - 46.0 %   MCV 93.9 80.0 - 100.0 fL   MCH 29.5 26.0 - 34.0 pg   MCHC 31.4 30.0 - 36.0 g/dL   RDW 14.8 11.5 - 15.5 %   Platelets 636 (H) 150 - 400 K/uL   nRBC 0.0 0.0 - 0.2 %   Neutrophils Relative % 64 %   Neutro Abs 7.4 1.7 - 7.7 K/uL   Lymphocytes Relative 21 %   Lymphs Abs 2.4 0.7 - 4.0 K/uL   Monocytes Relative 12 %   Monocytes Absolute 1.3 (H) 0.1 - 1.0 K/uL   Eosinophils Relative 1 %   Eosinophils Absolute 0.1 0.0 - 0.5 K/uL   Basophils Relative 1 %   Basophils Absolute 0.1 0.0 - 0.1 K/uL   Immature Granulocytes 1 %   Abs Immature Granulocytes 0.11 (H) 0.00 - 0.07 K/uL  CMET  Result Value Ref Range   Sodium 134 (L) 135 - 145 mmol/L   Potassium 3.6 3.5 - 5.1 mmol/L   Chloride 97 (L) 98 - 111 mmol/L   CO2 24 22 - 32 mmol/L   Glucose, Bld 125 (H) 70 - 99 mg/dL   BUN 10 8 - 23 mg/dL   Creatinine, Ser 0.59 0.44 - 1.00 mg/dL   Calcium  9.0 8.9 - 10.3 mg/dL   Total Protein 7.7 6.5 - 8.1 g/dL   Albumin 3.5 3.5 - 5.0 g/dL   AST 20 15 - 41 U/L   ALT 17 0 - 44 U/L   Alkaline Phosphatase 94 38 - 126 U/L   Total Bilirubin 0.6 0.3 - 1.2 mg/dL   GFR calc non Af Amer >60 >60 mL/min   GFR calc Af Amer >60 >60 mL/min   Anion gap 13 5 - 15  Lipase  Result Value Ref Range   Lipase 49 11 - 51 U/L  UA  Result Value Ref Range   Color, Urine YELLOW YELLOW   APPearance CLEAR CLEAR   Specific Gravity, Urine <1.005 (L) 1.005 - 1.030   pH 5.5 5.0 - 8.0   Glucose, UA NEGATIVE NEGATIVE mg/dL   Hgb urine dipstick NEGATIVE NEGATIVE   Bilirubin Urine NEGATIVE NEGATIVE   Ketones, ur NEGATIVE NEGATIVE mg/dL   Protein, ur NEGATIVE NEGATIVE mg/dL   Nitrite NEGATIVE NEGATIVE   Leukocytes,Ua NEGATIVE NEGATIVE   XR Chest PA/L  Result Date: 08/01/2019 CLINICAL DATA:  Pleural effusion EXAM: CHEST - 2 VIEW COMPARISON:  07/26/2019  FINDINGS: Small to moderate bilateral pleural effusions, decreasing since prior study. Bibasilar atelectasis. Heart is normal size. No acute bony abnormality. IMPRESSION: Small to moderate bilateral pleural effusions, decreasing since prior study. Bibasilar atelectasis. Electronically Signed   By: Rolm Baptise M.D.   On: 08/01/2019 18:29   DG Chest 2 View  Result Date: 07/26/2019 CLINICAL DATA:  Shortness of breath. EXAM: CHEST - 2 VIEW COMPARISON:  None. FINDINGS: Heart size and pulmonary vascularity are normal. There are small to moderate bilateral pleural effusions. No consolidative infiltrates. Slight compressive atelectasis at the lung bases. Bones are normal. IMPRESSION: 1. Small to moderate bilateral pleural effusions with slight compressive atelectasis at the lung bases. No consolidative infiltrates. 2. Heart size is normal. Electronically Signed   By: Lorriane Shire M.D.   On: 07/26/2019 19:06   CT Angio Chest PE W/Cm &/Or Wo Cm  Result Date: 08/01/2019 CLINICAL DATA:  64 year old female with shortness of breath,  recent lower extremity swelling, status post appendectomy on Christmas day. Bilateral pleural effusions on CTA chest 5 days ago. EXAM: CT ANGIOGRAPHY CHEST CT ABDOMEN AND PELVIS WITH CONTRAST TECHNIQUE: Multidetector CT imaging of the chest was performed using the standard protocol during bolus administration of intravenous contrast. Multiplanar CT image reconstructions and MIPs were obtained to evaluate the vascular anatomy. Multidetector CT imaging of the abdomen and pelvis was performed using the standard protocol during bolus administration of intravenous contrast. CONTRAST:  173mL OMNIPAQUE IOHEXOL 350 MG/ML SOLN COMPARISON:  Chest CTA 07/27/2019. CT Abdomen and Pelvis 05/08/2018. FINDINGS: CTA CHEST FINDINGS Cardiovascular: Good contrast bolus timing in the pulmonary arterial tree. No focal filling defect identified in the pulmonary arteries to suggest acute pulmonary embolism. No cardiomegaly or pericardial effusion. Negative visible aorta. Mediastinum/Nodes: No mediastinal lymphadenopathy. Lungs/Pleura: Right pleural effusion persists, remains small to moderate in size but is now partially loculated in the lower lobe (series 4, image 64). Small left pleural effusion has decreased but not resolved. There is associated enhancing compressive atelectasis at both lung bases. Major airways remain patent. A degree of bilateral perihilar bronchiectasis is stable. No other abnormal pulmonary opacity. Musculoskeletal: No acute osseous abnormality identified. Review of the MIP images confirms the above findings. CT ABDOMEN and PELVIS FINDINGS Hepatobiliary: Surgically absent gallbladder. Negative liver. Pancreas: Negative. Spleen: Negative. Adrenals/Urinary Tract: Normal adrenal glands. Bilateral renal enhancement and contrast excretion is symmetric and normal. Normal proximal ureters. Urinary bladder is diminutive and remains normal. Stomach/Bowel: Mild presacral stranding. Negative rectum. Sigmoid diverticulosis and  redundancy. Diverticulosis continues to the splenic flexure. No definite active inflammation in those segments. The transverse colon is completely decompressed. Asymmetric mesenteric stranding at the hepatic flexure and along the ascending colon but mild if any associated bowel wall thickening. Sequelae of appendectomy. Decompressed terminal ileum. No oral contrast was administered. There are scattered fluid-filled but only mildly dilated small bowel loops in the lower abdomen and pelvis. There are several possible small organized extraluminal fluid collections, including a small 15 millimeter possible intra loop collection amongst matted small bowel loops on coronal image 21, and possibly of small 23 millimeter collection in the deep pelvis on coronal image 37. No free air. Proximal small bowel loops are more normal. Negative stomach. Mildly dilated proximal duodenum. Vascular/Lymphatic: Mild Aortoiliac calcified atherosclerosis. Major arterial structures remain patent. Patent portal venous system. No lymphadenopathy. Reproductive: Surgically absent uterus. Adnexa remain within normal limits. Other: No pelvic free fluid. Musculoskeletal: Chronic L5 pars fractures and L5-S1 spondylolisthesis with disc and posterior element degeneration. No acute osseous  abnormality identified. Review of the MIP images confirms the above findings. IMPRESSION: 1. Negative for acute pulmonary embolus. 2. Small to moderate right pleural effusions is now partially loculated in the right lower lobe. Small left pleural effusion has decreased but not resolved. Associated compressive atelectasis at both lung bases. 3. Status post appendectomy. There is mild inflammatory stranding along the right colon, but no evidence of bowel obstruction or perforation. Difficult to exclude one or two small 15-20 mm diameter abscesses in the lower abdomen and pelvis in the absence of oral contrast. A repeat CT Abdomen and Pelvis with oral and IV contrast is  recommended if fever abdominal pain persists. 4. No other acute or inflammatory process identified in the chest abdomen or pelvis. Chronic L5 pars fractures and L5-S1 spondylolisthesis. Aortic Atherosclerosis (ICD10-I70.0). Electronically Signed   By: Genevie Ann M.D.   On: 08/01/2019 20:07   CT Angio Chest PE W/Cm &/Or Wo Cm  Result Date: 07/27/2019 CLINICAL DATA:  Shortness of breath and leg swelling for the past 2 days. Recent appendectomy on Christmas day. EXAM: CT ANGIOGRAPHY CHEST WITH CONTRAST TECHNIQUE: Multidetector CT imaging of the chest was performed using the standard protocol during bolus administration of intravenous contrast. Multiplanar CT image reconstructions and MIPs were obtained to evaluate the vascular anatomy. CONTRAST:  17mL OMNIPAQUE IOHEXOL 350 MG/ML SOLN COMPARISON:  Chest x-ray from yesterday. FINDINGS: Cardiovascular: Satisfactory opacification of the pulmonary arteries to the segmental level. No evidence of pulmonary embolism. Normal heart size. No pericardial effusion. No thoracic aortic aneurysm or dissection. Mediastinum/Nodes: No enlarged mediastinal, hilar, or axillary lymph nodes. Thyroid gland, trachea, and esophagus demonstrate no significant findings. Lungs/Pleura: Small to moderate bilateral pleural effusions with associated dependent atelectasis in both lower lobes. No consolidation or pneumothorax. No suspicious pulmonary nodule. Upper Abdomen: No acute abnormality. Status post cholecystectomy. Musculoskeletal: 10 x 9 mm nodule in the right breast. No acute or significant osseous findings. Review of the MIP images confirms the above findings. IMPRESSION: 1. No evidence of pulmonary embolism. 2. Small to moderate bilateral pleural effusions with associated dependent atelectasis in both lower lobes. 3. 10 x 9 mm nodule in the right breast. Correlation with mammography is recommended. Electronically Signed   By: Titus Dubin M.D.   On: 07/27/2019 12:45   CT Abdomen Pelvis  W Contrast  Result Date: 08/01/2019 CLINICAL DATA:  64 year old female with shortness of breath, recent lower extremity swelling, status post appendectomy on Christmas day. Bilateral pleural effusions on CTA chest 5 days ago. EXAM: CT ANGIOGRAPHY CHEST CT ABDOMEN AND PELVIS WITH CONTRAST TECHNIQUE: Multidetector CT imaging of the chest was performed using the standard protocol during bolus administration of intravenous contrast. Multiplanar CT image reconstructions and MIPs were obtained to evaluate the vascular anatomy. Multidetector CT imaging of the abdomen and pelvis was performed using the standard protocol during bolus administration of intravenous contrast. CONTRAST:  128mL OMNIPAQUE IOHEXOL 350 MG/ML SOLN COMPARISON:  Chest CTA 07/27/2019. CT Abdomen and Pelvis 05/08/2018. FINDINGS: CTA CHEST FINDINGS Cardiovascular: Good contrast bolus timing in the pulmonary arterial tree. No focal filling defect identified in the pulmonary arteries to suggest acute pulmonary embolism. No cardiomegaly or pericardial effusion. Negative visible aorta. Mediastinum/Nodes: No mediastinal lymphadenopathy. Lungs/Pleura: Right pleural effusion persists, remains small to moderate in size but is now partially loculated in the lower lobe (series 4, image 64). Small left pleural effusion has decreased but not resolved. There is associated enhancing compressive atelectasis at both lung bases. Major airways remain patent. A degree of  bilateral perihilar bronchiectasis is stable. No other abnormal pulmonary opacity. Musculoskeletal: No acute osseous abnormality identified. Review of the MIP images confirms the above findings. CT ABDOMEN and PELVIS FINDINGS Hepatobiliary: Surgically absent gallbladder. Negative liver. Pancreas: Negative. Spleen: Negative. Adrenals/Urinary Tract: Normal adrenal glands. Bilateral renal enhancement and contrast excretion is symmetric and normal. Normal proximal ureters. Urinary bladder is diminutive and  remains normal. Stomach/Bowel: Mild presacral stranding. Negative rectum. Sigmoid diverticulosis and redundancy. Diverticulosis continues to the splenic flexure. No definite active inflammation in those segments. The transverse colon is completely decompressed. Asymmetric mesenteric stranding at the hepatic flexure and along the ascending colon but mild if any associated bowel wall thickening. Sequelae of appendectomy. Decompressed terminal ileum. No oral contrast was administered. There are scattered fluid-filled but only mildly dilated small bowel loops in the lower abdomen and pelvis. There are several possible small organized extraluminal fluid collections, including a small 15 millimeter possible intra loop collection amongst matted small bowel loops on coronal image 21, and possibly of small 23 millimeter collection in the deep pelvis on coronal image 37. No free air. Proximal small bowel loops are more normal. Negative stomach. Mildly dilated proximal duodenum. Vascular/Lymphatic: Mild Aortoiliac calcified atherosclerosis. Major arterial structures remain patent. Patent portal venous system. No lymphadenopathy. Reproductive: Surgically absent uterus. Adnexa remain within normal limits. Other: No pelvic free fluid. Musculoskeletal: Chronic L5 pars fractures and L5-S1 spondylolisthesis with disc and posterior element degeneration. No acute osseous abnormality identified. Review of the MIP images confirms the above findings. IMPRESSION: 1. Negative for acute pulmonary embolus. 2. Small to moderate right pleural effusions is now partially loculated in the right lower lobe. Small left pleural effusion has decreased but not resolved. Associated compressive atelectasis at both lung bases. 3. Status post appendectomy. There is mild inflammatory stranding along the right colon, but no evidence of bowel obstruction or perforation. Difficult to exclude one or two small 15-20 mm diameter abscesses in the lower abdomen  and pelvis in the absence of oral contrast. A repeat CT Abdomen and Pelvis with oral and IV contrast is recommended if fever abdominal pain persists. 4. No other acute or inflammatory process identified in the chest abdomen or pelvis. Chronic L5 pars fractures and L5-S1 spondylolisthesis. Aortic Atherosclerosis (ICD10-I70.0). Electronically Signed   By: Genevie Ann M.D.   On: 08/01/2019 20:07    EKG EKG Interpretation  Date/Time:  Sunday August 01 2019 18:04:51 EST Ventricular Rate:  92 PR Interval:    QRS Duration: 77 QT Interval:  346 QTC Calculation: 428 R Axis:   82 Text Interpretation: Sinus rhythm No significant change since last tracing Confirmed by Lajean Saver [phone]) on 08/01/2019 6:07:29 PM   Radiology XR Chest PA/L  Result Date: 08/01/2019 CLINICAL DATA:  Pleural effusion EXAM: CHEST - 2 VIEW COMPARISON:  07/26/2019 FINDINGS: Small to moderate bilateral pleural effusions, decreasing since prior study. Bibasilar atelectasis. Heart is normal size. No acute bony abnormality. IMPRESSION: Small to moderate bilateral pleural effusions, decreasing since prior study. Bibasilar atelectasis. Electronically Signed   By: Rolm Baptise M.D.   On: 08/01/2019 18:29   CT Angio Chest PE W/Cm &/Or Wo Cm  Result Date: 08/01/2019 CLINICAL DATA:  64 year old female with shortness of breath, recent lower extremity swelling, status post appendectomy on Christmas day. Bilateral pleural effusions on CTA chest 5 days ago. EXAM: CT ANGIOGRAPHY CHEST CT ABDOMEN AND PELVIS WITH CONTRAST TECHNIQUE: Multidetector CT imaging of the chest was performed using the standard protocol during bolus administration of intravenous contrast. Multiplanar  CT image reconstructions and MIPs were obtained to evaluate the vascular anatomy. Multidetector CT imaging of the abdomen and pelvis was performed using the standard protocol during bolus administration of intravenous contrast. CONTRAST:  158mL OMNIPAQUE IOHEXOL 350 MG/ML SOLN  COMPARISON:  Chest CTA 07/27/2019. CT Abdomen and Pelvis 05/08/2018. FINDINGS: CTA CHEST FINDINGS Cardiovascular: Good contrast bolus timing in the pulmonary arterial tree. No focal filling defect identified in the pulmonary arteries to suggest acute pulmonary embolism. No cardiomegaly or pericardial effusion. Negative visible aorta. Mediastinum/Nodes: No mediastinal lymphadenopathy. Lungs/Pleura: Right pleural effusion persists, remains small to moderate in size but is now partially loculated in the lower lobe (series 4, image 64). Small left pleural effusion has decreased but not resolved. There is associated enhancing compressive atelectasis at both lung bases. Major airways remain patent. A degree of bilateral perihilar bronchiectasis is stable. No other abnormal pulmonary opacity. Musculoskeletal: No acute osseous abnormality identified. Review of the MIP images confirms the above findings. CT ABDOMEN and PELVIS FINDINGS Hepatobiliary: Surgically absent gallbladder. Negative liver. Pancreas: Negative. Spleen: Negative. Adrenals/Urinary Tract: Normal adrenal glands. Bilateral renal enhancement and contrast excretion is symmetric and normal. Normal proximal ureters. Urinary bladder is diminutive and remains normal. Stomach/Bowel: Mild presacral stranding. Negative rectum. Sigmoid diverticulosis and redundancy. Diverticulosis continues to the splenic flexure. No definite active inflammation in those segments. The transverse colon is completely decompressed. Asymmetric mesenteric stranding at the hepatic flexure and along the ascending colon but mild if any associated bowel wall thickening. Sequelae of appendectomy. Decompressed terminal ileum. No oral contrast was administered. There are scattered fluid-filled but only mildly dilated small bowel loops in the lower abdomen and pelvis. There are several possible small organized extraluminal fluid collections, including a small 15 millimeter possible intra loop  collection amongst matted small bowel loops on coronal image 21, and possibly of small 23 millimeter collection in the deep pelvis on coronal image 37. No free air. Proximal small bowel loops are more normal. Negative stomach. Mildly dilated proximal duodenum. Vascular/Lymphatic: Mild Aortoiliac calcified atherosclerosis. Major arterial structures remain patent. Patent portal venous system. No lymphadenopathy. Reproductive: Surgically absent uterus. Adnexa remain within normal limits. Other: No pelvic free fluid. Musculoskeletal: Chronic L5 pars fractures and L5-S1 spondylolisthesis with disc and posterior element degeneration. No acute osseous abnormality identified. Review of the MIP images confirms the above findings. IMPRESSION: 1. Negative for acute pulmonary embolus. 2. Small to moderate right pleural effusions is now partially loculated in the right lower lobe. Small left pleural effusion has decreased but not resolved. Associated compressive atelectasis at both lung bases. 3. Status post appendectomy. There is mild inflammatory stranding along the right colon, but no evidence of bowel obstruction or perforation. Difficult to exclude one or two small 15-20 mm diameter abscesses in the lower abdomen and pelvis in the absence of oral contrast. A repeat CT Abdomen and Pelvis with oral and IV contrast is recommended if fever abdominal pain persists. 4. No other acute or inflammatory process identified in the chest abdomen or pelvis. Chronic L5 pars fractures and L5-S1 spondylolisthesis. Aortic Atherosclerosis (ICD10-I70.0). Electronically Signed   By: Genevie Ann M.D.   On: 08/01/2019 20:07   CT Abdomen Pelvis W Contrast  Result Date: 08/01/2019 CLINICAL DATA:  64 year old female with shortness of breath, recent lower extremity swelling, status post appendectomy on Christmas day. Bilateral pleural effusions on CTA chest 5 days ago. EXAM: CT ANGIOGRAPHY CHEST CT ABDOMEN AND PELVIS WITH CONTRAST TECHNIQUE:  Multidetector CT imaging of the chest was performed using the  standard protocol during bolus administration of intravenous contrast. Multiplanar CT image reconstructions and MIPs were obtained to evaluate the vascular anatomy. Multidetector CT imaging of the abdomen and pelvis was performed using the standard protocol during bolus administration of intravenous contrast. CONTRAST:  177mL OMNIPAQUE IOHEXOL 350 MG/ML SOLN COMPARISON:  Chest CTA 07/27/2019. CT Abdomen and Pelvis 05/08/2018. FINDINGS: CTA CHEST FINDINGS Cardiovascular: Good contrast bolus timing in the pulmonary arterial tree. No focal filling defect identified in the pulmonary arteries to suggest acute pulmonary embolism. No cardiomegaly or pericardial effusion. Negative visible aorta. Mediastinum/Nodes: No mediastinal lymphadenopathy. Lungs/Pleura: Right pleural effusion persists, remains small to moderate in size but is now partially loculated in the lower lobe (series 4, image 64). Small left pleural effusion has decreased but not resolved. There is associated enhancing compressive atelectasis at both lung bases. Major airways remain patent. A degree of bilateral perihilar bronchiectasis is stable. No other abnormal pulmonary opacity. Musculoskeletal: No acute osseous abnormality identified. Review of the MIP images confirms the above findings. CT ABDOMEN and PELVIS FINDINGS Hepatobiliary: Surgically absent gallbladder. Negative liver. Pancreas: Negative. Spleen: Negative. Adrenals/Urinary Tract: Normal adrenal glands. Bilateral renal enhancement and contrast excretion is symmetric and normal. Normal proximal ureters. Urinary bladder is diminutive and remains normal. Stomach/Bowel: Mild presacral stranding. Negative rectum. Sigmoid diverticulosis and redundancy. Diverticulosis continues to the splenic flexure. No definite active inflammation in those segments. The transverse colon is completely decompressed. Asymmetric mesenteric stranding at the  hepatic flexure and along the ascending colon but mild if any associated bowel wall thickening. Sequelae of appendectomy. Decompressed terminal ileum. No oral contrast was administered. There are scattered fluid-filled but only mildly dilated small bowel loops in the lower abdomen and pelvis. There are several possible small organized extraluminal fluid collections, including a small 15 millimeter possible intra loop collection amongst matted small bowel loops on coronal image 21, and possibly of small 23 millimeter collection in the deep pelvis on coronal image 37. No free air. Proximal small bowel loops are more normal. Negative stomach. Mildly dilated proximal duodenum. Vascular/Lymphatic: Mild Aortoiliac calcified atherosclerosis. Major arterial structures remain patent. Patent portal venous system. No lymphadenopathy. Reproductive: Surgically absent uterus. Adnexa remain within normal limits. Other: No pelvic free fluid. Musculoskeletal: Chronic L5 pars fractures and L5-S1 spondylolisthesis with disc and posterior element degeneration. No acute osseous abnormality identified. Review of the MIP images confirms the above findings. IMPRESSION: 1. Negative for acute pulmonary embolus. 2. Small to moderate right pleural effusions is now partially loculated in the right lower lobe. Small left pleural effusion has decreased but not resolved. Associated compressive atelectasis at both lung bases. 3. Status post appendectomy. There is mild inflammatory stranding along the right colon, but no evidence of bowel obstruction or perforation. Difficult to exclude one or two small 15-20 mm diameter abscesses in the lower abdomen and pelvis in the absence of oral contrast. A repeat CT Abdomen and Pelvis with oral and IV contrast is recommended if fever abdominal pain persists. 4. No other acute or inflammatory process identified in the chest abdomen or pelvis. Chronic L5 pars fractures and L5-S1 spondylolisthesis. Aortic  Atherosclerosis (ICD10-I70.0). Electronically Signed   By: Genevie Ann M.D.   On: 08/01/2019 20:07    Procedures Procedures (including critical care time)  Medications Ordered in ED Medications  HYDROmorphone (DILAUDID) injection 1 mg (has no administration in time range)  ondansetron (ZOFRAN) injection 4 mg (has no administration in time range)    ED Course  I have reviewed the triage vital signs  and the nursing notes.  Pertinent labs & imaging results that were available during my care of the patient were reviewed by me and considered in my medical decision making (see chart for details).    MDM Rules/Calculators/A&P                      Iv ns. Labs sent. Cxr.   Reviewed nursing notes and prior charts for additional history. Recent CTA chest 1/4 for same symptoms, neg for PE. covid test then also negative. Patient did have pleural effusions.   Dilaudid iv. zofran iv.   Recheck pain improved.   Labs reviewed/interpreted by me - wbc elev.   CXR reviewed/interpreted by me - effusions, decreased from prior. Pain persists, worse w deep breath, sob - will get ct.   CTs reviewed/interpreted by me - no PE. Effusion ?loculated.   Given worsening pain, elev wbc, effusion ?underlying infl, will tx w abx. Zosyn iv.   Additional labs reviewed/interpreted by me - covid negative.   Patient is breathing comfortably, pulse ox 96% room air. Recheck abd soft nt.  Patient currently appears stable for d/c.   Rec outpt pulm f/u.  Return precautions provided.        Final Clinical Impression(s) / ED Diagnoses Final diagnoses:  None    Rx / DC Orders ED Discharge Orders    None           Lajean Saver, MD 08/01/19 [id number]

## 2019-08-01 NOTE — ED Notes (Signed)
ED Provider at bedside. 

## 2019-08-01 NOTE — ED Notes (Signed)
Pt returned from CT °

## 2019-08-01 NOTE — Discharge Instructions (Addendum)
It was our pleasure to provide your ER care today - we hope that you feel better.  Take antibiotic as prescribed.   Take ibuprofen as need for pain. You may also take ultram as need for pain - no driving for the next 6 hours, or when taking ultram.  For pleural effusion(s), follow up with pulmonary specialist in the next 1-2 weeks - call office tomorrow to arrange appointment.   Return to ER if worse, new symptoms, worsening or severe pain, increased trouble breathing, high fevers, weak/fainting, or other concern.

## 2019-08-03 ENCOUNTER — Other Ambulatory Visit: Payer: Self-pay | Admitting: Internal Medicine

## 2019-08-03 DIAGNOSIS — N6310 Unspecified lump in the right breast, unspecified quadrant: Secondary | ICD-10-CM

## 2019-08-06 ENCOUNTER — Telehealth: Payer: Self-pay | Admitting: Internal Medicine

## 2019-08-06 ENCOUNTER — Encounter: Payer: Self-pay | Admitting: Internal Medicine

## 2019-08-06 ENCOUNTER — Ambulatory Visit (INDEPENDENT_AMBULATORY_CARE_PROVIDER_SITE_OTHER): Payer: 59 | Admitting: Internal Medicine

## 2019-08-06 ENCOUNTER — Other Ambulatory Visit: Payer: Self-pay

## 2019-08-06 ENCOUNTER — Inpatient Hospital Stay (HOSPITAL_COMMUNITY)
Admit: 2019-08-06 | Discharge: 2019-08-08 | DRG: 186 | Disposition: A | Discharge status: Home / Self Care | Payer: 59 | Source: Ambulatory Visit | Attending: Family Medicine | Admitting: Family Medicine

## 2019-08-06 VITALS — BP 118/68 | HR 96 | Temp 98.0°F | Ht 63.5 in | Wt 133.8 lb

## 2019-08-06 DIAGNOSIS — Z20822 Contact with and (suspected) exposure to covid-19: Secondary | ICD-10-CM | POA: Diagnosis present

## 2019-08-06 DIAGNOSIS — Z79899 Other long term (current) drug therapy: Secondary | ICD-10-CM | POA: Diagnosis not present

## 2019-08-06 DIAGNOSIS — Z888 Allergy status to other drugs, medicaments and biological substances status: Secondary | ICD-10-CM

## 2019-08-06 DIAGNOSIS — Z9049 Acquired absence of other specified parts of digestive tract: Secondary | ICD-10-CM | POA: Diagnosis not present

## 2019-08-06 DIAGNOSIS — J329 Chronic sinusitis, unspecified: Secondary | ICD-10-CM | POA: Diagnosis present

## 2019-08-06 DIAGNOSIS — G43909 Migraine, unspecified, not intractable, without status migrainosus: Secondary | ICD-10-CM | POA: Diagnosis present

## 2019-08-06 DIAGNOSIS — E041 Nontoxic single thyroid nodule: Secondary | ICD-10-CM | POA: Diagnosis present

## 2019-08-06 DIAGNOSIS — F329 Major depressive disorder, single episode, unspecified: Secondary | ICD-10-CM | POA: Diagnosis present

## 2019-08-06 DIAGNOSIS — J90 Pleural effusion, not elsewhere classified: Secondary | ICD-10-CM

## 2019-08-06 DIAGNOSIS — J302 Other seasonal allergic rhinitis: Secondary | ICD-10-CM | POA: Diagnosis present

## 2019-08-06 DIAGNOSIS — K589 Irritable bowel syndrome without diarrhea: Secondary | ICD-10-CM | POA: Diagnosis present

## 2019-08-06 DIAGNOSIS — M810 Age-related osteoporosis without current pathological fracture: Secondary | ICD-10-CM | POA: Diagnosis present

## 2019-08-06 DIAGNOSIS — J9601 Acute respiratory failure with hypoxia: Secondary | ICD-10-CM | POA: Diagnosis present

## 2019-08-06 DIAGNOSIS — J3089 Other allergic rhinitis: Secondary | ICD-10-CM | POA: Diagnosis present

## 2019-08-06 DIAGNOSIS — Z90710 Acquired absence of both cervix and uterus: Secondary | ICD-10-CM | POA: Diagnosis not present

## 2019-08-06 DIAGNOSIS — Z882 Allergy status to sulfonamides status: Secondary | ICD-10-CM

## 2019-08-06 DIAGNOSIS — Z79891 Long term (current) use of opiate analgesic: Secondary | ICD-10-CM

## 2019-08-06 DIAGNOSIS — Z9889 Other specified postprocedural states: Secondary | ICD-10-CM

## 2019-08-06 DIAGNOSIS — N3000 Acute cystitis without hematuria: Secondary | ICD-10-CM

## 2019-08-06 DIAGNOSIS — Z8249 Family history of ischemic heart disease and other diseases of the circulatory system: Secondary | ICD-10-CM | POA: Diagnosis not present

## 2019-08-06 DIAGNOSIS — Z823 Family history of stroke: Secondary | ICD-10-CM

## 2019-08-06 MED ORDER — DIPHENHYDRAMINE HCL 25 MG PO CAPS
25.0000 mg | ORAL_CAPSULE | Freq: Once | ORAL | Status: AC
  Administered 2019-08-07: 25 mg via ORAL
  Filled 2019-08-06: qty 1

## 2019-08-06 NOTE — Progress Notes (Signed)
[facility] Progress Note Patient Name: Stacey Calhoun DOB: [birth year] MRN: [id number]   Date of Service  08/06/2019  HPI/Events of Note  On review of the chart Dr. Ranae Pila spoke with [facility] regarding admission of Stacey Calhoun for semi-elective chest tube placement.  eICU Interventions  I spoke with Dr. Jabier Mutton. He will admit patient and consult PCCM in the morning .        Kerry Kass Tyjay Galindo 08/06/2019, 11:00 PM

## 2019-08-06 NOTE — Telephone Encounter (Signed)
Instructions from OV with Dr. Shearon Stalls today 1/15:   Return to Care: She will be admitted to the hospital.  Her follow-up can be arranged after this by the inpatient team.  Lenice Llamas, MD Pulmonary and [place]  CC: Crist Infante, MD       Patient Instructions by Spero Geralds, MD at 08/06/2019 11:15 AM Author: Spero Geralds, MD Author Type: Physician Filed: 08/06/2019 12:05 PM  Note Status: Addendum Mickle Mallory: Cosign Not Required Encounter Date: 08/06/2019  Editor: Spero Geralds, MD (Physician)  Prior Versions: 1. Spero Geralds, MD (Physician) at 08/06/2019 12:04 PM - Signed    Please wait for a phone call from hospital admissions at [facility].  You will be admitted for a chest tube placement.      Called and spoke with pt and stated to her that hospital admissions at [facility] will be contacting her when they have a bed avail for her. Pt verbalized understanding. Nothing further needed.

## 2019-08-06 NOTE — Progress Notes (Signed)
Stacey Calhoun    [id number]    29-Nov-1955  Primary Care Physician:Perini, Elta Guadeloupe, MD  Referring Physician: Crist Infante, [address],  [place] [address] Reason for Consultation: pleural effusion Date of Consultation: 08/06/2019  Chief complaint:   Chief Complaint  Patient presents with  . Consult    developed b/l pleural effusion post emergency appendectomy 07/17/2019     HPI: The patient has minimal past medical history.  Unfortunately at the end of last year, she underwent an emergency appendectomy on 12/26. Worsening shortness of breath. Right sided chest pain. Pain is sharp and stabbing and wakes her up. She also had a UTI around the same time and was seen in [facility] emergency department.  Unfortunately she had persistence of her symptoms and [facility] [place] [facility].  Was treated with lasix as well.  On flagyl and cipro.  Pain is being controlled with tramadol, but it is not working well.  She has not had a bowel movement in 3 days.  She has factor V Leiden - heterozygote. No history of DVT/PE.   The patient cares for her husband who has ALS, and is currently in hospice facility for respite care.  Social history:  Occupation: has worked as a Emergency planning/management officer, Aeronautical engineer Exposures: lives with her husband who is on hospice for ALS.  Smoking history: never smoker  Social History   Occupational History  . Not on file  Tobacco Use  . Smoking status: Never Smoker  . Smokeless tobacco: Never Used  Substance and Sexual Activity  . Alcohol use: No  . Drug use: No  . Sexual activity: Yes    Partners: Male    Birth control/protection: Surgical    Comment: hysterectomy    Relevant family history:  Family History  Problem Relation Age of Onset  . Hypertension Mother   . Osteoporosis Mother   . Stroke Mother   . Thyroid disease Mother   . Cancer Father        bladder  . Hypertension Father   . Stroke Father   .  Hypertension Sister   . Hypertension Brother   . Other Brother        Factor 3  . Diabetes Maternal Grandmother   . Autoimmune disease Neg Hx     Past Medical History:  Diagnosis Date  . Diverticulitis   . Fibroid   . IBS (irritable bowel syndrome)   . Interstitial cystitis   . Menorrhagia   . Migraines   . Osteoporosis    on reclast  . Thyroid nodule    u/s 2011 neg    Past Surgical History:  Procedure Laterality Date  . APPENDECTOMY    . BREAST SURGERY  [id number]   fibroadenoma in rt & left breast  . CHOLECYSTECTOMY    . TONSILLECTOMY    . VAGINAL HYSTERECTOMY  2005     Review of systems: Review of Systems  Constitutional: Negative for chills, fever and weight loss.  HENT: Negative for congestion, sinus pain and sore throat.   Eyes: Negative for discharge and redness.  Respiratory: Negative for cough, hemoptysis, sputum production, shortness of breath and wheezing.   Cardiovascular: Positive for chest pain and leg swelling. Negative for palpitations.  Gastrointestinal: Positive for constipation. Negative for heartburn, nausea and vomiting.  Musculoskeletal: Negative for joint pain and myalgias.  Skin: Negative for rash.  Neurological: Negative for dizziness, tremors, focal weakness and  headaches.  Endo/Heme/Allergies: Negative for environmental allergies.  Psychiatric/Behavioral: Negative for depression. The patient is not nervous/anxious.   All other systems reviewed and are negative.   Physical Exam: Blood pressure 118/68, pulse 96, temperature 98 F (36.7 C), temperature source Temporal, height 5' 3.5" (1.613 m), weight 133 lb 12.8 oz (60.7 kg), last menstrual period 07/23/2003, SpO2 96 %. Gen:      No acute distress Eyes: EOMI, sclera anicteric ENT:  no nasal polyps, mucus membranes moist +cobblestoning in oropharynx. Neck:     Supple, no thyromegaly Lungs:    Decreased breath sounds right lung base, no wheezes or crackles, pain with inspiration.  CV:          Regular rate and rhythm; no murmurs, rubs, or gallops.  No pedal edema Abd:      + bowel sounds; soft, non-tender; no distension MSK: no acute synovitis of DIP or PIP joints, no mechanics hands.  Skin:      Warm and dry; no rashes Neuro: normal speech, no focal facial asymmetry Psych: alert and oriented x3, normal mood and affect  Data Reviewed: Imaging: I have personally reviewed the CT Chest 1/10 demonstrates right sided pleural effusion.   Labs: UA from 1/5 shows small leukocyte esterase, positive nitrites, rare bacteria, 6-10 white blood cells.  Chemistry from 1/10 shows elevated white count to 11.4.  Her BNP on 1/5 was 54.  Immunization status: Immunization History  Administered Date(s) Administered  . Influenza, High Dose Seasonal PF 05/06/2019  . Influenza,inj,Quad PF,6+ Mos 05/02/2018  . Zoster Recombinat (Shingrix) 07/18/2017     Assessment:  Loculated Pleural Effusion Recent UTI Recent Appendicitis requiring emergency appendectomy 12/26  Plan/Recommendations: The patient has pleuritic chest pain with evidence of loculated pleural effusion on the right side that corresponds to her CT scan.  Unfortunately loculated effusion can only be managed with chest tube drainage.  This cannot be safely done in the outpatient setting.  I have discussed this patient's plan of care with the hospitalist team, as well as my colleague Dr. Tamala Julian in the inpatient side.  He will be working this weekend as well.  She is constipated and have advised to take a suppository for this.  I also advised Nexium or similar proton pump inhibitor with NSAIDs to help with the pain until she is admitted.  Would stop the tramadol since not helping.   I spent 60 minutes on 08/06/2019 in care of this patient including face to face time and non-face to face time spent charting, review of outside records, and coordination of care including direct admission to the hospital.   Return to Care: She will be  admitted to the hospital.  Her follow-up can be arranged after this by the inpatient team.  Lenice Llamas, MD Pulmonary and [place]  CC: Crist Infante, MD

## 2019-08-06 NOTE — Patient Instructions (Addendum)
Please wait for a phone call from hospital admissions at [facility].  You will be admitted for a chest tube placement.

## 2019-08-07 ENCOUNTER — Encounter (HOSPITAL_COMMUNITY): Payer: Self-pay | Admitting: Internal Medicine

## 2019-08-07 ENCOUNTER — Inpatient Hospital Stay (HOSPITAL_COMMUNITY): Payer: 59

## 2019-08-07 DIAGNOSIS — J90 Pleural effusion, not elsewhere classified: Principal | ICD-10-CM

## 2019-08-07 LAB — COMPREHENSIVE METABOLIC PANEL
ALT: 19 U/L (ref 0–44)
AST: 36 U/L (ref 15–41)
Albumin: 2.5 g/dL — ABNORMAL LOW (ref 3.5–5.0)
Alkaline Phosphatase: 87 U/L (ref 38–126)
Anion gap: 10 (ref 5–15)
BUN: 9 mg/dL (ref 8–23)
CO2: 26 mmol/L (ref 22–32)
Calcium: 8.7 mg/dL — ABNORMAL LOW (ref 8.9–10.3)
Chloride: 100 mmol/L (ref 98–111)
Creatinine, Ser: 0.56 mg/dL (ref 0.44–1.00)
GFR calc Af Amer: >60 mL/min (ref >60)
GFR calc non Af Amer: >60 mL/min (ref >60)
Glucose, Bld: 120 mg/dL — ABNORMAL HIGH (ref 70–99)
Potassium: 3.9 mmol/L (ref 3.5–5.1)
Sodium: 136 mmol/L (ref 135–145)
Total Bilirubin: 0.3 mg/dL (ref 0.3–1.2)
Total Protein: 5.8 g/dL — ABNORMAL LOW (ref 6.5–8.1)

## 2019-08-07 LAB — BODY FLUID CELL COUNT WITH DIFFERENTIAL
Eos, Fluid: 9 %
Lymphs, Fluid: 32 %
Monocyte-Macrophage-Serous Fluid: 25 % — ABNORMAL LOW (ref 50–90)
Neutrophil Count, Fluid: 34 % — ABNORMAL HIGH (ref 0–25)
Total Nucleated Cell Count, Fluid: 2255 cu mm — ABNORMAL HIGH (ref 0–1000)

## 2019-08-07 LAB — CBC WITH DIFFERENTIAL/PLATELET
Abs Immature Granulocytes: 0.04 10*3/uL (ref 0.00–0.07)
Basophils Absolute: 0.1 10*3/uL (ref 0.0–0.1)
Basophils Relative: 1 %
Eosinophils Absolute: 0.2 10*3/uL (ref 0.0–0.5)
Eosinophils Relative: 3 %
HCT: 33.3 % — ABNORMAL LOW (ref 36.0–46.0)
Hemoglobin: 10.9 g/dL — ABNORMAL LOW (ref 12.0–15.0)
Immature Granulocytes: 1 %
Lymphocytes Relative: 27 %
Lymphs Abs: 1.8 10*3/uL (ref 0.7–4.0)
MCH: 30 pg (ref 26.0–34.0)
MCHC: 32.7 g/dL (ref 30.0–36.0)
MCV: 91.7 fL (ref 80.0–100.0)
Monocytes Absolute: 0.9 10*3/uL (ref 0.1–1.0)
Monocytes Relative: 14 %
Neutro Abs: 3.6 10*3/uL (ref 1.7–7.7)
Neutrophils Relative %: 54 %
Platelets: 467 10*3/uL — ABNORMAL HIGH (ref 150–400)
RBC: 3.63 MIL/uL — ABNORMAL LOW (ref 3.87–5.11)
RDW: 14.1 % (ref 11.5–15.5)
WBC: 6.5 10*3/uL (ref 4.0–10.5)
nRBC: 0 % (ref 0.0–0.2)

## 2019-08-07 LAB — GLUCOSE, PLEURAL OR PERITONEAL FLUID: Glucose, Fluid: 118 mg/dL

## 2019-08-07 LAB — PROTIME-INR
INR: 1 (ref 0.8–1.2)
Prothrombin Time: 12.9 seconds (ref 11.4–15.2)

## 2019-08-07 LAB — HIV ANTIBODY (ROUTINE TESTING W REFLEX): HIV Screen 4th Generation wRfx: NONREACTIVE

## 2019-08-07 LAB — LACTATE DEHYDROGENASE, PLEURAL OR PERITONEAL FLUID: LD, Fluid: 172 U/L — ABNORMAL HIGH (ref 3–23)

## 2019-08-07 LAB — SARS CORONAVIRUS 2 (TAT 6-24 HRS): SARS Coronavirus 2: NEGATIVE

## 2019-08-07 LAB — PROTEIN, PLEURAL OR PERITONEAL FLUID: Total protein, fluid: 4.4 g/dL

## 2019-08-07 MED ORDER — ACETAMINOPHEN 325 MG PO TABS
650.0000 mg | ORAL_TABLET | Freq: Four times a day (QID) | ORAL | Status: DC | PRN
  Administered 2019-08-07: 650 mg via ORAL
  Filled 2019-08-07: qty 2

## 2019-08-07 MED ORDER — METOCLOPRAMIDE HCL 5 MG/ML IJ SOLN
10.0000 mg | Freq: Four times a day (QID) | INTRAMUSCULAR | Status: DC
  Administered 2019-08-07 – 2019-08-08 (×3): 10 mg via INTRAVENOUS
  Filled 2019-08-07 (×3): qty 2

## 2019-08-07 MED ORDER — METRONIDAZOLE IN NACL 5-0.79 MG/ML-% IV SOLN
500.0000 mg | Freq: Three times a day (TID) | INTRAVENOUS | Status: DC
  Administered 2019-08-07 – 2019-08-08 (×4): 500 mg via INTRAVENOUS
  Filled 2019-08-07 (×6): qty 100

## 2019-08-07 MED ORDER — DOXYLAMINE SUCCINATE (SLEEP) 25 MG PO TABS
25.0000 mg | ORAL_TABLET | Freq: Every evening | ORAL | Status: DC | PRN
  Administered 2019-08-07: 25 mg via ORAL
  Filled 2019-08-07 (×2): qty 1

## 2019-08-07 MED ORDER — FLUOXETINE HCL 10 MG PO CAPS
10.0000 mg | ORAL_CAPSULE | Freq: Every day | ORAL | Status: DC
  Administered 2019-08-07 – 2019-08-08 (×2): 10 mg via ORAL
  Filled 2019-08-07 (×2): qty 1

## 2019-08-07 MED ORDER — KETOROLAC TROMETHAMINE 15 MG/ML IJ SOLN
15.0000 mg | Freq: Once | INTRAMUSCULAR | Status: AC
  Administered 2019-08-07: 15 mg via INTRAVENOUS
  Filled 2019-08-07: qty 1

## 2019-08-07 MED ORDER — CIPROFLOXACIN IN D5W 400 MG/200ML IV SOLN
400.0000 mg | Freq: Two times a day (BID) | INTRAVENOUS | Status: DC
  Administered 2019-08-07 – 2019-08-08 (×4): 400 mg via INTRAVENOUS
  Filled 2019-08-07 (×5): qty 200

## 2019-08-07 MED ORDER — ONDANSETRON HCL 4 MG/2ML IJ SOLN
4.0000 mg | Freq: Once | INTRAMUSCULAR | Status: AC
  Administered 2019-08-07: 4 mg via INTRAVENOUS
  Filled 2019-08-07: qty 2

## 2019-08-07 MED ORDER — ONDANSETRON HCL 4 MG PO TABS: 4.0000 mg | ORAL_TABLET | Freq: Four times a day (QID) | ORAL | Status: DC | PRN

## 2019-08-07 MED ORDER — NON FORMULARY: 5.0000 mg | Freq: Once | Status: DC

## 2019-08-07 MED ORDER — KETOROLAC TROMETHAMINE 30 MG/ML IJ SOLN
30.0000 mg | Freq: Four times a day (QID) | INTRAMUSCULAR | Status: DC
  Administered 2019-08-07 – 2019-08-08 (×3): 30 mg via INTRAVENOUS
  Filled 2019-08-07 (×3): qty 1

## 2019-08-07 MED ORDER — ONDANSETRON HCL 4 MG PO TABS: 4.0000 mg | ORAL_TABLET | Freq: Four times a day (QID) | ORAL | 0 refills | Status: DC | PRN

## 2019-08-07 MED ORDER — ACETAMINOPHEN 650 MG RE SUPP: 650.0000 mg | Freq: Four times a day (QID) | RECTAL | Status: DC | PRN

## 2019-08-07 MED ORDER — MAGNESIUM CITRATE PO SOLN
1.0000 | Freq: Once | ORAL | Status: AC
  Administered 2019-08-07: 1 via ORAL
  Filled 2019-08-07: qty 296

## 2019-08-07 MED ORDER — ONDANSETRON HCL 4 MG/2ML IJ SOLN
4.0000 mg | Freq: Four times a day (QID) | INTRAMUSCULAR | Status: DC | PRN
  Administered 2019-08-07: 4 mg via INTRAVENOUS
  Filled 2019-08-07: qty 2

## 2019-08-07 MED ORDER — RAMELTEON 8 MG PO TABS
8.0000 mg | ORAL_TABLET | Freq: Every day | ORAL | Status: DC
  Filled 2019-08-07 (×2): qty 1

## 2019-08-07 MED ORDER — MELATONIN 3 MG PO TABS
4.5000 mg | ORAL_TABLET | Freq: Once | ORAL | Status: AC
  Administered 2019-08-07: 4.5 mg via ORAL
  Filled 2019-08-07: qty 1.5

## 2019-08-07 NOTE — H&P (Signed)
History and Physical    Stacey Calhoun [id number] DOB: 23-Jan-1956 DOA: 08/06/2019  PCP: Crist Infante, MD  Patient coming from: Home.  Chief Complaint: Shortness of breath.  HPI: Stacey Calhoun is a 64 y.o. female with history of migraines who has had recent surgery for perforated appendicitis at [facility] in [facility] on July 16, 2019 during which patient was hospitalized for a week following which patient became more short of breath and right-sided chest pain for which patient had a CT scan of the chest and abdomen done on January 05/11/2019 which showed loculated right-sided pleural effusion.  Was referred to the pulmonologist had repeat chest x-ray showed still persistent loculated pleural effusion.  Was referred to direct admission for chest tube placement.  Patient's CAT scan also showed some fluid collections in the abdomen.  Was placed on empiric antibiotics.  Patient states she gets short of breath and has noticed some lower extremity edema which patient has been on Lasix last 2 days.  Patient is also on Cipro and Flagyl last 2 days likely 4 will collection in the abdomen.  Denies any fever chills.  ED Course: Patient is a direct admit.  All labs are pending.  Review of Systems: As per HPI, rest all negative.   Past Medical History:  Diagnosis Date  . Diverticulitis   . Fibroid   . IBS (irritable bowel syndrome)   . Interstitial cystitis   . Menorrhagia   . Migraines   . Osteoporosis    on reclast  . Thyroid nodule    u/s 2011 neg    Past Surgical History:  Procedure Laterality Date  . APPENDECTOMY    . BREAST SURGERY  [id number]   fibroadenoma in rt & left breast  . CHOLECYSTECTOMY    . TONSILLECTOMY    . VAGINAL HYSTERECTOMY  2005     reports that she has never smoked. She has never used smokeless tobacco. She reports that she does not drink alcohol or use drugs.  Allergies  Allergen Reactions  . Singulair [Montelukast] Other (See  Comments)    Headaches, irritability   . Sulfa Antibiotics Rash    Family History  Problem Relation Age of Onset  . Hypertension Mother   . Osteoporosis Mother   . Stroke Mother   . Thyroid disease Mother   . Cancer Father        bladder  . Hypertension Father   . Stroke Father   . Hypertension Sister   . Hypertension Brother   . Other Brother        Factor 3  . Diabetes Maternal Grandmother   . Autoimmune disease Neg Hx     Prior to Admission medications   Medication Sig Start Date End Date Taking? Authorizing Provider  Acetaminophen (TYLENOL PO) Take by mouth as needed.    [provider]  Calcium Carbonate-Vitamin D (CALCIUM + D PO) Take by mouth daily.    [provider]  cetirizine (ZYRTEC) 10 MG tablet Take 10 mg by mouth as needed for allergies.    [provider]  ciprofloxacin (CIPRO) 500 MG tablet Take 500 mg by mouth 2 (two) times daily. 08/04/19   [provider]  FLUoxetine (PROZAC) 10 MG capsule Take by mouth daily. 08/04/17   [provider]  furosemide (LASIX) 20 MG tablet Take 1 tablet (20 mg total) by mouth daily for 4 days. 07/28/19 08/01/19  Carmin Muskrat, MD  metroNIDAZOLE (FLAGYL) 500 MG tablet Take 500 mg  by mouth 3 (three) times daily. 08/04/19   [provider]  Probiotic Product (PROBIOTIC PO) Take by mouth daily.    [provider]  SUMAtriptan (IMITREX) 50 MG tablet Take 1 tablet (50 mg total) by mouth once. May repeat in 2 hours if headache persists or recurs. 08/01/15   Regina Eck, CNM  traMADol (ULTRAM) 50 MG tablet Take 1 tablet (50 mg total) by mouth every 6 (six) hours as needed. 08/01/19   Lajean Saver, MD    Physical Exam: Constitutional: Moderately built and nourished. Vitals:   08/06/19 2150  BP: 138/82  Pulse: 88  Resp: 19  Temp: 98.5 F (36.9 C)  TempSrc: Oral  SpO2: 98%  Weight: 60.3 kg  Height: 5' 3.5" (1.613 m)   Eyes: Anicteric no pallor. ENMT: No  discharge from the ears eyes nose or mouth. Neck: No JVD appreciated no mass felt. Respiratory: No rhonchi or crepitations. Cardiovascular: S1-S2 heard. Abdomen: Soft nontender bowel sounds present. Musculoskeletal: Bilateral lower extremity edema. Skin: No rash. Neurologic: Alert awake oriented to time place and person.  Moves all extremities. Psychiatric: Appears normal per normal affect.   Labs on Admission: I have personally reviewed following labs and imaging studies  CBC: Recent Labs  Lab 08/01/19 1744  WBC 11.4*  NEUTROABS 7.4  HGB 13.1  HCT 41.7  MCV 93.9  PLT [id number]*   Basic Metabolic Panel: Recent Labs  Lab 08/01/19 1744  NA 134*  K 3.6  CL 97*  CO2 24  GLUCOSE 125*  BUN 10  CREATININE 0.59  CALCIUM 9.0   GFR: Estimated Creatinine Clearance: 62.5 mL/min (by C-G formula based on SCr of 0.59 mg/dL). Liver Function Tests: Recent Labs  Lab 08/01/19 1744  AST 20  ALT 17  ALKPHOS 94  BILITOT 0.6  PROT 7.7  ALBUMIN 3.5   Recent Labs  Lab 08/01/19 1744  LIPASE 49   No results for input(s): AMMONIA in the last 168 hours. Coagulation Profile: No results for input(s): INR, PROTIME in the last 168 hours. Cardiac Enzymes: No results for input(s): CKTOTAL, CKMB, CKMBINDEX, TROPONINI in the last 168 hours. BNP (last 3 results) No results for input(s): PROBNP in the last 8760 hours. HbA1C: No results for input(s): HGBA1C in the last 72 hours. CBG: No results for input(s): GLUCAP in the last 168 hours. Lipid Profile: No results for input(s): CHOL, HDL, LDLCALC, TRIG, CHOLHDL, LDLDIRECT in the last 72 hours. Thyroid Function Tests: No results for input(s): TSH, T4TOTAL, FREET4, T3FREE, THYROIDAB in the last 72 hours. Anemia Panel: No results for input(s): VITAMINB12, FOLATE, FERRITIN, TIBC, IRON, RETICCTPCT in the last 72 hours. Urine analysis:    Component Value Date/Time   COLORURINE YELLOW 08/01/2019 2039   APPEARANCEUR CLEAR 08/01/2019 2039    LABSPEC <1.005 (L) 08/01/2019 2039   PHURINE 5.5 08/01/2019 2039   GLUCOSEU NEGATIVE 08/01/2019 2039   HGBUR NEGATIVE 08/01/2019 2039   BILIRUBINUR NEGATIVE 08/01/2019 2039   BILIRUBINUR n 12/24/2016 1119   KETONESUR NEGATIVE 08/01/2019 2039   PROTEINUR NEGATIVE 08/01/2019 2039   UROBILINOGEN negative (A) 12/24/2016 1119   NITRITE NEGATIVE 08/01/2019 2039   LEUKOCYTESUR NEGATIVE 08/01/2019 2039   Sepsis Labs: @LABRCNTIP (procalcitonin:4,lacticidven:4) ) Recent Results (from the past 240 hour(s))  Respiratory Panel by RT PCR (Flu A&B, Covid) - Nasopharyngeal Swab     Status: None   Collection Time: 08/01/19  5:45 PM   Specimen: Nasopharyngeal Swab  Result Value Ref Range Status   SARS Coronavirus 2 by RT PCR  NEGATIVE NEGATIVE Final    Comment: (NOTE) SARS-CoV-2 target nucleic acids are NOT DETECTED. The SARS-CoV-2 RNA is generally detectable in upper respiratoy specimens during the acute phase of infection. The lowest concentration of SARS-CoV-2 viral copies this assay can detect is 131 copies/mL. A negative result does not preclude SARS-Cov-2 infection and should not be used as the sole basis for treatment or other patient management decisions. A negative result may occur with  improper specimen collection/handling, submission of specimen other than nasopharyngeal swab, presence of viral mutation(s) within the areas targeted by this assay, and inadequate number of viral copies (<131 copies/mL). A negative result must be combined with clinical observations, patient history, and epidemiological information. The expected result is Negative. Fact Sheet for Patients:  PinkCheek.be Fact Sheet for Healthcare Providers:  GravelBags.it This test is not yet ap proved or cleared by the [place] FDA and  has been authorized for detection and/or diagnosis of SARS-CoV-2 by FDA under an Emergency Use Authorization (EUA). This EUA  will remain  in effect (meaning this test can be used) for the duration of the COVID-19 declaration under Section 564(b)(1) of the Act, 21 U.S.C. section 360bbb-3(b)(1), unless the authorization is terminated or revoked sooner.    Influenza A by PCR NEGATIVE NEGATIVE Final   Influenza B by PCR NEGATIVE NEGATIVE Final    Comment: (NOTE) The Xpert Xpress SARS-CoV-2/FLU/RSV assay is intended as an aid in  the diagnosis of influenza from Nasopharyngeal swab specimens and  should not be used as a sole basis for treatment. Nasal washings and  aspirates are unacceptable for Xpert Xpress SARS-CoV-2/FLU/RSV  testing. Fact Sheet for Patients: PinkCheek.be Fact Sheet for Healthcare Providers: GravelBags.it This test is not yet approved or cleared by the [place] FDA and  has been authorized for detection and/or diagnosis of SARS-CoV-2 by  FDA under an Emergency Use Authorization (EUA). This EUA will remain  in effect (meaning this test can be used) for the duration of the  Covid-19 declaration under Section 564(b)(1) of the Act, 21  U.S.C. section 360bbb-3(b)(1), unless the authorization is  terminated or revoked. Performed at [facility], [place] [address] [place], [place] [address]      Radiological Exams on Admission: No results found.   Assessment/Plan Principal Problem:   Pleural effusion Active Problems:   Loculated pleural effusion    1. Loculated right-sided pleural effusion -pulmonologist has been notified about the patient's admission presently on empiric antibiotics Cipro and Flagyl which will be continued likely started for intra-abdominal cause.  We will keep patient n.p.o. in anticipation of procedure. 2. Recent surgery for perforated appendicitis.  Recent CAT scan did show some fluid collection patient's abdomen appears benign.  Patient afebrile.  Presently on Cipro and Flagyl which will be  continued. 3. History of migraines. 4. History of depression on fluoxetine.  Since patient has acute respiratory failure with hypoxia and will need procedure will need inpatient status.  Covid test is pending.  All labs are pending.   DVT prophylaxis: SCDs in anticipation of procedure. Code Status: Full code. Family Communication: Patient's sister at the bedside. Disposition Plan: Home when stable. Consults called: Pulmonary. Admission status: Inpatient.   Rise Patience MD [facility] Pager [phone].  If 7PM-7AM, please contact night-coverage [email or web address] Password [email or web address]  08/07/2019, 1:13 AM

## 2019-08-07 NOTE — Procedures (Signed)
Thoracentesis Procedure Note  Pre-operative Diagnosis: Pleural effusion, etiology uncertain  Post-operative Diagnosis: Awaiting results of laboratory analysis   Indications: Diagnostic and theraputic  Procedure Details  Consent: Informed consent was obtained. Risks of the procedure were discussed including: infection, bleeding, pain, pneumothorax.  Under sterile conditions the patient was positioned. Betadine solution and sterile drapes were utilized.  1% buffered lidocaine, 1% plain lidocaine was used to anesthetize approximately the 9th rib space. Fluid was obtained without any difficulties and minimal blood loss.  A dressing was applied to the wound and wound care instructions were provided.   Findings ~600 ml of straw colored pleural fluid was obtained. A sample was sent to Pathology for cytogenetics, flow, and cell counts, as well as for infection analysis.  Complications:  None; patient tolerated the procedure well.          Condition: stable  Plan A follow up chest x-ray was ordered. Bed Rest for 4 hours. Tylenol 650 mg. for pain.  Kathi Ludwig, MD [facility] Internal Medicine, PGY-3  Please see Attending Attestation:

## 2019-08-07 NOTE — Significant Event (Signed)
Rapid Response Event Note  Bedside Thoracentesis   Patient was already on continuous telemetry and pulse-ox monitoring. VS taken - stable. Time out was done, patient was positioned appropriately and RT sided Thoracentesis was completed. VS remained stable, patient tolerated well, no acute distress, post - procedure VS. Patient pending post - procedure CXR.   Start Time 0945 End Time 1025  Debbie Yearick R

## 2019-08-07 NOTE — Progress Notes (Signed)
Pharmacy Antibiotic Note  Stacey Calhoun is a 64 y.o. female admitted on 08/06/2019 with intra-abdominal infection.  Pharmacy has been consulted for Cipro dosing. WBC WNL. Renal function ok. S/P appendectomy with fluid collection, also have pleural effusion.   Plan: Cipro 400 mg IV q12h Flagyl per MD Trend WBC, temp, renal function  F/U infectious work-up   Height: 5' 3.5" (161.3 cm) Weight: 132 lb 14.4 oz (60.3 kg) IBW/kg (Calculated) : 53.55  Temp (24hrs), Avg:98.3 F (36.8 C), Min:98 F (36.7 C), Max:98.5 F (36.9 C)  Recent Labs  Lab 08/01/19 1744  WBC 11.4*  CREATININE 0.59    Estimated Creatinine Clearance: 62.5 mL/min (by C-G formula based on SCr of 0.59 mg/dL).    Allergies  Allergen Reactions  . Singulair [Montelukast] Other (See Comments)    Headaches, irritability   . Sulfa Antibiotics Rash   Narda Bonds, PharmD, BCPS Clinical Pharmacist Phone: [phone]

## 2019-08-07 NOTE — Progress Notes (Signed)
Plan for d/c to home. Pt vocalized concerns over cont pain of lower chest and nausea that have not been relieved by meds given. Dr Tamala Julian contacted. POC discussed with pt. Okay to cancel d/c for today per Dr Kyung Bacca.

## 2019-08-07 NOTE — Progress Notes (Signed)
Still nauseous and SOB even after tap. Will give some reglan and toradol tonight. Check dry CT chest. Hopefully feeling better by AM, I'll check on her.  Erskine Emery MD

## 2019-08-07 NOTE — Progress Notes (Signed)
PROGRESS NOTE  Stacey Calhoun [id number] DOB: [birth year] DOA: 08/06/2019 PCP: Crist Infante, MD  HPI/Recap of past 24 hours: Per admitting H&P Stacey Calhoun is a 64 y.o. female with history of migraines who has had recent surgery for perforated appendicitis at [facility] in [facility] on July 16, 2019 during which patient was hospitalized for a week following which patient became more short of breath and right-sided chest pain for which patient had a CT scan of the chest and abdomen done on January 05/11/2019 which showed loculated right-sided pleural effusion.  Was referred to the pulmonologist had repeat chest x-ray showed still persistent loculated pleural effusion.  Was referred to direct admission for chest tube placement.  Patient's CAT scan also showed some fluid collections in the abdomen.  Was placed on empiric antibiotics.  Patient states she gets short of breath and has noticed some lower extremity edema which patient has been on Lasix last 2 days.  Patient is also on Cipro and Flagyl last 2 days likely 4 will collection in the abdomen.  Denies any fever chills.   Subjective: Patient seen and examined at bedside.  She felt eager to be discharged home, her sister was in the room Ms. Santiago Glad but later on she called that she was still feeling nauseated.  Especially after she had a thoracentesis she did not have to have a chest tube placed and critical care physician agreed that she could go home but patient requested to stay another line because she feels nauseous when she eats and she generally does not feel well Assessment/Plan: Principal Problem:   Pleural effusion Active Problems:   Loculated pleural effusion #1 right pleural effusion status post paracentesis.  It did not require to put in a chest tube.  She is currently on empiric Cipro and Flagyl  2.  History of depression continue fluoxetine  3.  History of migraine.  Patient currently headache free  Code  Status: Full  Severity of Illness: The appropriate patient status for this patient is INPATIENT. Inpatient status is judged to be reasonable and necessary in order to provide the required intensity of service to ensure the patient's safety. The patient's presenting symptoms, physical exam findings, and initial radiographic and laboratory data in the context of their chronic comorbidities is felt to place them at high risk for further clinical deterioration. Furthermore, it is not anticipated that the patient will be medically stable for discharge from the hospital within 2 midnights of admission. The following factors support the patient status of inpatient.  Patient still complaining of feeling nausea when she eats and was not comfortable being discharged today wanted to spend another night she had just started full liquid diet and wanted to* I certify that at the point of admission it is my clinical judgment that the patient will require inpatient hospital care spanning beyond 2 midnights from the point of admission due to high intensity of service, high risk for further deterioration and high frequency of surveillance required.*    Family Communication: Sister Santiago Glad  Disposition Plan: Home possibly tomorrow   Consultants:  Critical care medicine  Procedures:  Thoracentesis  Antimicrobials:  Cipro IV  DVT prophylaxis: SCD   Objective: Vitals:   08/06/19 2150 08/07/19 0532 08/07/19 0947 08/07/19 1017  BP: 138/82 140/84 134/82 137/90  Pulse: 88 86 94 96  Resp: 19 20 18 20   Temp: 98.5 F (36.9 C) 99.2 F (37.3 C)    TempSrc: Oral Oral    SpO2: 98%  96% 96% 100%  Weight: 60.3 kg 59.7 kg    Height: 5' 3.5" (1.613 m)      No intake or output data in the 24 hours ending 08/07/19 1601 Filed Weights   08/06/19 2150 08/07/19 0532  Weight: 60.3 kg 59.7 kg   Body mass index is 22.96 kg/m.  Exam:  . General: 64 y.o. year-old female well developed well nourished in no acute  distress.  Alert and oriented x3. . Cardiovascular: Regular rate and rhythm with no rubs or gallops.  No thyromegaly or JVD noted.   Marland Kitchen Respiratory: Clear to auscultation with no wheezes or rales. Good inspiratory effort. . Abdomen: Soft nontender nondistended with normal bowel sounds x4 quadrants. . Musculoskeletal: No lower extremity edema. 2/4 pulses in all 4 extremities. . Skin: No ulcerative lesions noted or rashes, . Psychiatry: Mood is appropriate for condition and setting    Data Reviewed: CBC: Recent Labs  Lab 08/01/19 1744 08/07/19 0350  WBC 11.4* 6.5  NEUTROABS 7.4 3.6  HGB 13.1 10.9*  HCT 41.7 33.3*  MCV 93.9 91.7  PLT 636* 0000000*   Basic Metabolic Panel: Recent Labs  Lab 08/01/19 1744 08/07/19 0350  NA 134* 136  K 3.6 3.9  CL 97* 100  CO2 24 26  GLUCOSE 125* 120*  BUN 10 9  CREATININE 0.59 0.56  CALCIUM 9.0 8.7*   GFR: Estimated Creatinine Clearance: 62.5 mL/min (by C-G formula based on SCr of 0.56 mg/dL). Liver Function Tests: Recent Labs  Lab 08/01/19 1744 08/07/19 0350  AST 20 36  ALT 17 19  ALKPHOS 94 87  BILITOT 0.6 0.3  PROT 7.7 5.8*  ALBUMIN 3.5 2.5*   Recent Labs  Lab 08/01/19 1744  LIPASE 49   No results for input(s): AMMONIA in the last 168 hours. Coagulation Profile: Recent Labs  Lab 08/07/19 0350  INR 1.0   Cardiac Enzymes: No results for input(s): CKTOTAL, CKMB, CKMBINDEX, TROPONINI in the last 168 hours. BNP (last 3 results) No results for input(s): PROBNP in the last 8760 hours. HbA1C: No results for input(s): HGBA1C in the last 72 hours. CBG: No results for input(s): GLUCAP in the last 168 hours. Lipid Profile: No results for input(s): CHOL, HDL, LDLCALC, TRIG, CHOLHDL, LDLDIRECT in the last 72 hours. Thyroid Function Tests: No results for input(s): TSH, T4TOTAL, FREET4, T3FREE, THYROIDAB in the last 72 hours. Anemia Panel: No results for input(s): VITAMINB12, FOLATE, FERRITIN, TIBC, IRON, RETICCTPCT in the last 72  hours. Urine analysis:    Component Value Date/Time   COLORURINE YELLOW 08/01/2019 2039   APPEARANCEUR CLEAR 08/01/2019 2039   LABSPEC <1.005 (L) 08/01/2019 2039   PHURINE 5.5 08/01/2019 2039   GLUCOSEU NEGATIVE 08/01/2019 2039   HGBUR NEGATIVE 08/01/2019 2039   BILIRUBINUR NEGATIVE 08/01/2019 2039   BILIRUBINUR n 12/24/2016 1119   KETONESUR NEGATIVE 08/01/2019 2039   PROTEINUR NEGATIVE 08/01/2019 2039   UROBILINOGEN negative (A) 12/24/2016 1119   NITRITE NEGATIVE 08/01/2019 2039   LEUKOCYTESUR NEGATIVE 08/01/2019 2039   Sepsis Labs: @LABRCNTIP (procalcitonin:4,lacticidven:4)  ) Recent Results (from the past 240 hour(s))  Respiratory Panel by RT PCR (Flu A&B, Covid) - Nasopharyngeal Swab     Status: None   Collection Time: 08/01/19  5:45 PM   Specimen: Nasopharyngeal Swab  Result Value Ref Range Status   SARS Coronavirus 2 by RT PCR NEGATIVE NEGATIVE Final    Comment: (NOTE) SARS-CoV-2 target nucleic acids are NOT DETECTED. The SARS-CoV-2 RNA is generally detectable in upper respiratoy specimens during the acute  phase of infection. The lowest concentration of SARS-CoV-2 viral copies this assay can detect is 131 copies/mL. A negative result does not preclude SARS-Cov-2 infection and should not be used as the sole basis for treatment or other patient management decisions. A negative result may occur with  improper specimen collection/handling, submission of specimen other than nasopharyngeal swab, presence of viral mutation(s) within the areas targeted by this assay, and inadequate number of viral copies (<131 copies/mL). A negative result must be combined with clinical observations, patient history, and epidemiological information. The expected result is Negative. Fact Sheet for Patients:  PinkCheek.be Fact Sheet for Healthcare Providers:  GravelBags.it This test is not yet ap proved or cleared by the [place]  FDA and  has been authorized for detection and/or diagnosis of SARS-CoV-2 by FDA under an Emergency Use Authorization (EUA). This EUA will remain  in effect (meaning this test can be used) for the duration of the COVID-19 declaration under Section 564(b)(1) of the Act, 21 U.S.C. section 360bbb-3(b)(1), unless the authorization is terminated or revoked sooner.    Influenza A by PCR NEGATIVE NEGATIVE Final   Influenza B by PCR NEGATIVE NEGATIVE Final    Comment: (NOTE) The Xpert Xpress SARS-CoV-2/FLU/RSV assay is intended as an aid in  the diagnosis of influenza from Nasopharyngeal swab specimens and  should not be used as a sole basis for treatment. Nasal washings and  aspirates are unacceptable for Xpert Xpress SARS-CoV-2/FLU/RSV  testing. Fact Sheet for Patients: PinkCheek.be Fact Sheet for Healthcare Providers: GravelBags.it This test is not yet approved or cleared by the [place] FDA and  has been authorized for detection and/or diagnosis of SARS-CoV-2 by  FDA under an Emergency Use Authorization (EUA). This EUA will remain  in effect (meaning this test can be used) for the duration of the  Covid-19 declaration under Section 564(b)(1) of the Act, 21  U.S.C. section 360bbb-3(b)(1), unless the authorization is  terminated or revoked. Performed at [facility], [place] [address], [place], [place] [address]   SARS CORONAVIRUS 2 (TAT 6-24 HRS) Nasopharyngeal Nasopharyngeal Swab     Status: None   Collection Time: 08/06/19 10:22 PM   Specimen: Nasopharyngeal Swab  Result Value Ref Range Status   SARS Coronavirus 2 NEGATIVE NEGATIVE Final    Comment: (NOTE) SARS-CoV-2 target nucleic acids are NOT DETECTED. The SARS-CoV-2 RNA is generally detectable in upper and lower respiratory specimens during the acute phase of infection. Negative results do not preclude SARS-CoV-2 infection, do not rule out co-infections with  other pathogens, and should not be used as the sole basis for treatment or other patient management decisions. Negative results must be combined with clinical observations, patient history, and epidemiological information. The expected result is Negative. Fact Sheet for Patients: SugarRoll.be Fact Sheet for Healthcare Providers: https://www.woods-mathews.com/ This test is not yet approved or cleared by the [place] FDA and  has been authorized for detection and/or diagnosis of SARS-CoV-2 by FDA under an Emergency Use Authorization (EUA). This EUA will remain  in effect (meaning this test can be used) for the duration of the COVID-19 declaration under Section 56 4(b)(1) of the Act, 21 U.S.C. section 360bbb-3(b)(1), unless the authorization is terminated or revoked sooner. Performed at [facility], [place] [address], [place], [place] [address]   Body fluid culture (includes gram stain)     Status: None (Preliminary result)   Collection Time: 08/07/19 10:09 AM   Specimen: Pleural Fluid  Result Value Ref Range Status  Specimen Description PLEURAL  Final   Special Requests NONE  Final   Gram Stain   Final    FEW WBC PRESENT,BOTH PMN AND MONONUCLEAR NO ORGANISMS SEEN Performed at [facility], [address], [place], [place] [address]    Culture PENDING  Incomplete   Report Status PENDING  Incomplete      Studies: DG Chest Port 1 View  Result Date: 08/07/2019 CLINICAL DATA:  64 year old female with history of pleural effusion. EXAM: PORTABLE CHEST 1 VIEW COMPARISON:  Chest x-ray 08/01/2019. FINDINGS: Moderate right and small left pleural effusions. Decreased lung volumes. Bibasilar opacities which may reflect areas of atelectasis and/or consolidation. No pneumothorax. No definite suspicious appearing pulmonary nodules or masses are noted. No evidence of pulmonary edema. Heart size is normal. Upper mediastinal contours are within  normal limits. IMPRESSION: 1. Moderate right and small left pleural effusions with bibasilar opacities favored to reflect areas of subsegmental atelectasis. Electronically Signed   By: Vinnie Langton M.D.   On: 08/07/2019 11:01    Scheduled Meds: . FLUoxetine  10 mg Oral Daily  . ondansetron (ZOFRAN) IV  4 mg Intravenous Once    Continuous Infusions: . ciprofloxacin 400 mg (08/07/19 0907)  . metronidazole 500 mg (08/07/19 1232)     LOS: 1 day     Cristal Deer, MD [facility]  To reach me or the doctor on call, go to: [email or web address] Password [email or web address]  08/07/2019, 4:01 PM

## 2019-08-07 NOTE — Progress Notes (Signed)
Notified by central tele- that pt had 20 beat run of SVT- pt is asymptomatic- will monitor closely  I notified [facility] on call with this information

## 2019-08-07 NOTE — Progress Notes (Signed)
Pulmonology Progress Note  S: Seen in f/u for pleural effusion with pleurisy. Still having right pleurisy and DOE.  Anxious about procedure.  O: Blood pressure 137/90, pulse 96, temperature 99.2 F (37.3 C), temperature source Oral, resp. rate 20, height 5' 3.5" (1.613 m), weight 59.7 kg, last menstrual period 07/23/2003, SpO2 100 %.  GEN: thin woman in NAD HEENT: MMM, no thrush CV: RRR, ext warm PULM: Diminished R base, small-moderate R effusion on [place] without obvious septations GI: Soft, +BS EXT: No edema NEURO: Self transfers, ambulatory PSYCH: AOx3, good insight SKIN: No rashes  A:  # R pleural effusion- suspicion reactive related to recent perforated appendicitis. # Postoperative fluid collections- in abdomen, on cipro flagyl  P:  - Would do about 7 days cipro/flagyl - R pleural space drained with good ultrasonographic resolution, some residual loculation that I would not worry about - Patient should use IS and take motrin 400mg  with meals for next couple days - She requests a strengthened bowel regimen: gave mag citrate x 1, consider script for lactulose or mag citrate on discharge - I will call her for tele visit Tuesday - Fine for discharge from my end   08/07/2019 Erskine Emery MD

## 2019-08-08 LAB — TRIGLYCERIDES, BODY FLUIDS: Triglycerides, Fluid: 45 mg/dL

## 2019-08-08 MED ORDER — CIPROFLOXACIN HCL 500 MG PO TABS: 500.0000 mg | ORAL_TABLET | Freq: Two times a day (BID) | ORAL | 0 refills | Status: AC

## 2019-08-08 MED ORDER — IBUPROFEN 200 MG PO TABS: 200.0000 mg | ORAL_TABLET | Freq: Three times a day (TID) | ORAL | 0 refills | Status: AC

## 2019-08-08 NOTE — Evaluation (Signed)
Physical Therapy Evaluation Patient Details Name: Stacey Calhoun MRN: [id number] DOB: 06/23/1956 Today's Date: 08/08/2019   History of Present Illness  64 y.o. female with history of migraines who has had recent surgery for perforated appendicitis at [facility] in [facility] on July 16, 2019 during which patient was hospitalized for a week following which patient became more short of breath and right-sided chest pain for which patient had a CT scan of the chest and abdomen done on January 05/11/2019 which showed loculated right-sided pleural effusion. s/p right thoractensis 08/07/2019.  Clinical Impression  Pt presents with decreased endurance and dyspnea on exertion. Ambulating 120 feet with no assistive device without physical assist. SpO2 93% on RA, HR peak 104 bpm. 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 He will be in respite care until her recovery and her sister is available to assist initially. Education provided regarding energy conservation techniques, endurance progression, exercise recommendations, IS use, and environmental adaptations I.e. sitting down in shower. Suspect pt will progress well. No PT follow up recommended.     Follow Up Recommendations No PT follow up    Equipment Recommendations  None recommended by PT    Recommendations for Other Services       Precautions / Restrictions Precautions Precautions: Fall Restrictions Weight Bearing Restrictions: No      Mobility  Bed Mobility Overal bed mobility: Independent                Transfers Overall transfer level: Independent Equipment used: None                Ambulation/Gait Ambulation/Gait assistance: Modified independent (Device/Increase time)((increased time)) Gait Distance (Feet): 120 Feet Assistive device: None Gait Pattern/deviations: Step-through pattern;Decreased stride length Gait velocity: decreased Gait velocity interpretation: 1.31  - 2.62 ft/sec, indicative of limited community ambulator General Gait Details: Decreased speed for age; no gross unsteadiness  Financial trader Rankin (Stroke Patients Only)       Balance Overall balance assessment: No apparent balance deficits (not formally assessed)                                           Pertinent Vitals/Pain Pain Assessment: Faces Faces Pain Scale: Hurts little more Pain Location: right flank Pain Descriptors / Indicators: Grimacing Pain Intervention(s): Limited activity within patient's tolerance;Monitored during session    Home Living Family/patient expects to be discharged to:: Private residence Living Arrangements: Spouse/significant other Available Help at Discharge: Family;Available PRN/intermittently Type of Home: House Home Access: Ramped entrance     Home Layout: One level Home Equipment: Clinical cytogeneticist - 2 wheels      Prior Function Level of Independence: Independent         Comments: 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; pt assists him with bed mobility, hoyer lift transfers, all ADL's      Hand Dominance        Extremity/Trunk Assessment   Upper Extremity Assessment Upper Extremity Assessment: Overall WFL for tasks assessed    Lower Extremity Assessment Lower Extremity Assessment: Overall WFL for tasks assessed       Communication   Communication: No difficulties  Cognition Arousal/Alertness: Awake/alert Behavior During Therapy: WFL for tasks assessed/performed Overall Cognitive Status: Within Functional  Limits for tasks assessed                                        General Comments      Exercises     Assessment/Plan    PT Assessment Patent does not need any further PT services  PT Problem List         PT Treatment Interventions      PT Goals (Current goals can be found in the Care Plan section)  Acute  Rehab PT Goals Patient Stated Goal: "get stronger." PT Goal Formulation: All assessment and education complete, DC therapy    Frequency     Barriers to discharge        Co-evaluation               AM-PAC PT "6 Clicks" Mobility  Outcome Measure Help needed turning from your back to your side while in a flat bed without using bedrails?: None Help needed moving from lying on your back to sitting on the side of a flat bed without using bedrails?: None Help needed moving to and from a bed to a chair (including a wheelchair)?: None Help needed standing up from a chair using your arms (e.g., wheelchair or bedside chair)?: None Help needed to walk in hospital room?: None Help needed climbing 3-5 steps with a railing? : A Little 6 Click Score: 23    End of Session   Activity Tolerance: Patient tolerated treatment well Patient left: in bed;with call bell/phone within reach;with family/visitor present Nurse Communication: Mobility status PT Visit Diagnosis: Difficulty in walking, not elsewhere classified (R26.2)    Time: [id number] PT Time Calculation (min) (ACUTE ONLY): 21 min   Charges:   PT Evaluation $PT Eval Moderate Complexity: 1 Mod          Ellamae Sia, PT, DPT Acute Rehabilitation Services Pager [phone] Office [phone]   Willy Eddy 08/08/2019, 4:48 PM

## 2019-08-08 NOTE — Progress Notes (Signed)
Pulmonology Progress Note  S: Seen in f/u for pleural effusion with pleurisy. Slept well.  We discussed her CT.  O: Blood pressure 140/84, pulse 84, temperature 98 F (36.7 C), temperature source Oral, resp. rate 19, height 5' 3.5" (1.613 m), weight 58.5 kg, last menstrual period 07/23/2003, SpO2 96 %.  She appears more comfortable today Regular respirations Answering questions appropriately  A:  # R pleural effusion- reactive related to recent perforated appendicitis. # Postoperative fluid collections- in abdomen, on cipro flagyl  P:  - Would do about 7 days cipro/flagyl - CT reviewed: would not worry about residual fluid in lung, minimal and should scar down over time - Her continued pleurisy should be manageable with NSAIDs - Her continued DOE I suspect is more related to deconditioning, she can barely pull any volume on IS, she may benefit from PT consult but defer to primary - I have phone visit with her on Tuesday to review path and final cultures - Please reach out with questions/concerns   08/08/2019 Erskine Emery MD

## 2019-08-08 NOTE — Discharge Summary (Addendum)
Discharge Summary  Stacey Calhoun [id number] DOB: July 05, 1956  PCP: Crist Infante, MD  Admit date: 08/06/2019 Discharge date: 08/08/2019  Time spent: 32 minutes  Recommendations for Outpatient Follow-up:  1. Critical care pulmonology by telemedicine on Tuesday 2. Primary care provider in 1 to 2 weeks  Discharge Diagnoses:  Active Hospital Problems   Diagnosis Date Noted  . Pleural effusion 08/07/2019  . Loculated pleural effusion 08/07/2019  . Chronic sinusitis 03/31/2018  . Perennial allergic rhinitis with a probable nonallergic component 12/24/2016  . Migraine 12/24/2016    Resolved Hospital Problems  No resolved problems to display.    Discharge Condition: Improved  Diet recommendation: Cardiac  Vitals:   08/07/19 2051 08/08/19 0533  BP: 138/86 140/84  Pulse: 94 84  Resp: 19 19  Temp: 98.3 F (36.8 C) 98 F (36.7 C)  SpO2: 97% 96%    History of present illness:  HPI: Stacey Calhoun is a 64 y.o. female with history of migraines who has had recent surgery for perforated appendicitis at [facility] on July 16, 2019 during which patient was hospitalized for a week following which patient became more short of breath and right-sided chest pain for which patient had a CT scan of the chest and abdomen done on January 05/11/2019 which showed loculated right-sided pleural effusion.  Was referred to the pulmonologist had repeat chest x-ray showed still persistent loculated pleural effusion.  Was referred to direct admission for chest tube placement.  Patient's CAT scan also showed some fluid collections in the abdomen.  Was placed on empiric antibiotics.  Patient states she gets short of breath and has noticed some lower extremity edema which patient has been on Lasix last 2 days.  Patient is also on Cipro and Flagyl last 2 days   Denies any fever chills.   Hospital Course:  Principal Problem:   Pleural effusion Active Problems:   Perennial  allergic rhinitis with a probable nonallergic component   Migraine   Chronic sinusitis   Loculated pleural effusion   Patient was admitted due to shortness of breath and found to have right pleural effusion and she was admitted to progressive care started on oxygen IV fluid IV Cipro and IV Flagyl which she was already on  the Cipro on p.o.  and Flagyl for  2 days at home prior to admission.  Pulmonary note and recommendation is as noted below: # R pleural effusion- reactive related to recent perforated appendicitis. # Postoperative fluid collections- in abdomen, on cipro flagyl - Would do about 7 days cipro/flagyl - CT reviewed: would not worry about residual fluid in lung, minimal and should scar down over time - Her continued pleurisy should be manageable with NSAIDs - Her continued DOE I suspect is more related to deconditioning, she can barely pull any volume on IS,she may benefit from PT consult but defer to primary - I have phone visit with her on Tuesday to review path and final cultures  physical therapy was ordered and she will be evaluated by physical therapy  Procedures: Right thoracentesis on August 07, 2019 and 663ml of straw coloredpleural fluid was obtained Consultations:  Pulmonary critical medicine  Physical therapy   Discharge Exam: BP 140/84 (BP Location: Left Arm)   Pulse 84   Temp 98 F (36.7 C) (Oral)   Resp 19   Ht 5' 3.5" (1.613 m)   Wt 58.5 kg   LMP 07/23/2003   SpO2 96%   BMI 22.48 kg/m  General: Alert oriented x3 pleasant Cardiovascular: Regular rate and rhythm no murmur no edema Respiratory: Clear to auscultation bilaterally  Discharge Instructions You were cared for by a hospitalist during your hospital stay. If you have any questions about your discharge medications or the care you received while you were in the hospital after you are discharged, you can call the unit and asked to speak with the hospitalist on call if the hospitalist  that took care of you is not available. Once you are discharged, your primary care physician will handle any further medical issues. Please note that NO REFILLS for any discharge medications will be authorized once you are discharged, as it is imperative that you return to your primary care physician (or establish a relationship with a primary care physician if you do not have one) for your aftercare needs so that they can reassess your need for medications and monitor your lab values.  Discharge Instructions    Call MD for:  difficulty breathing, headache or visual disturbances   Complete by: As directed    Call MD for:  temperature >100.4   Complete by: As directed    Diet - low sodium heart healthy   Complete by: As directed    Diet - low sodium heart healthy   Complete by: As directed    Discharge instructions   Complete by: As directed    Complete p.o. metronidazole and Cipro.  Follow-up with primary care provider in 1 to 2 weeks.   Discharge instructions   Complete by: As directed    Would need to take  7 days of cipro/flagyl Follow-up with primary care provider in 1 to 2 weeks Follow-up with surgeon by telemedicine on Tuesday as agreed and scheduled by the general surgeon   Increase activity slowly   Complete by: As directed    Increase activity slowly   Complete by: As directed      Allergies as of 08/08/2019      Reactions   Singulair [montelukast] Other (See Comments)   Headaches, irritability    Sulfa Antibiotics Rash      Medication List    STOP taking these medications   doxylamine (Sleep) 25 MG tablet Commonly known as: UNISOM     TAKE these medications   ARTIFICIAL TEARS PF OP Place 1 drop into both eyes daily as needed (For dry eyes).   cetirizine 10 MG tablet Commonly known as: ZYRTEC Take 10 mg by mouth as needed for allergies.   ciprofloxacin 500 MG tablet Commonly known as: CIPRO Take 500 mg by mouth 2 (two) times daily. What changed: Another  medication with the same name was added. Make sure you understand how and when to take each.   ciprofloxacin 500 MG tablet Commonly known as: Cipro Take 1 tablet (500 mg total) by mouth 2 (two) times daily for 10 days. What changed: You were already taking a medication with the same name, and this prescription was added. Make sure you understand how and when to take each.   FLUoxetine 10 MG capsule Commonly known as: PROZAC Take 10 mg by mouth daily.   furosemide 20 MG tablet Commonly known as: LASIX Take 1 tablet (20 mg total) by mouth daily for 4 days.   ibuprofen 200 MG tablet Commonly known as: Motrin IB Take 1 tablet (200 mg total) by mouth 3 (three) times daily for 15 days.   Melatonin 5 MG Tabs Take 1 tablet by mouth at bedtime.   metroNIDAZOLE 500 MG  tablet Commonly known as: FLAGYL Take 500 mg by mouth 3 (three) times daily.   ondansetron 4 MG tablet Commonly known as: ZOFRAN Take 1 tablet (4 mg total) by mouth every 6 (six) hours as needed for nausea.   potassium chloride 10 MEQ tablet Commonly known as: KLOR-CON Take 10 mEq by mouth daily. What changed: Another medication with the same name was removed. Continue taking this medication, and follow the directions you see here.   PROBIOTIC PO Take by mouth daily.   SUMAtriptan 50 MG tablet Commonly known as: IMITREX Take 1 tablet (50 mg total) by mouth once. May repeat in 2 hours if headache persists or recurs. What changed:   when to take this  reasons to take this   traMADol 50 MG tablet Commonly known as: ULTRAM Take 1 tablet (50 mg total) by mouth every 6 (six) hours as needed. What changed: reasons to take this   TYLENOL PO Take by mouth as needed.      Allergies  Allergen Reactions  . Singulair [Montelukast] Other (See Comments)    Headaches, irritability   . Sulfa Antibiotics Rash      The results of significant diagnostics from this hospitalization (including imaging, microbiology,  ancillary and laboratory) are listed below for reference.    Significant Diagnostic Studies: XR Chest PA/L  Result Date: 08/01/2019 CLINICAL DATA:  Pleural effusion EXAM: CHEST - 2 VIEW COMPARISON:  07/26/2019 FINDINGS: Small to moderate bilateral pleural effusions, decreasing since prior study. Bibasilar atelectasis. Heart is normal size. No acute bony abnormality. IMPRESSION: Small to moderate bilateral pleural effusions, decreasing since prior study. Bibasilar atelectasis. Electronically Signed   By: Rolm Baptise M.D.   On: 08/01/2019 18:29   DG Chest 2 View  Result Date: 07/26/2019 CLINICAL DATA:  Shortness of breath. EXAM: CHEST - 2 VIEW COMPARISON:  None. FINDINGS: Heart size and pulmonary vascularity are normal. There are small to moderate bilateral pleural effusions. No consolidative infiltrates. Slight compressive atelectasis at the lung bases. Bones are normal. IMPRESSION: 1. Small to moderate bilateral pleural effusions with slight compressive atelectasis at the lung bases. No consolidative infiltrates. 2. Heart size is normal. Electronically Signed   By: Lorriane Shire M.D.   On: 07/26/2019 19:06   CT CHEST WO CONTRAST  Result Date: 08/07/2019 CLINICAL DATA:  Chest pain with exertion, recent surgery EXAM: CT CHEST WITHOUT CONTRAST TECHNIQUE: Multidetector CT imaging of the chest was performed following the standard protocol without IV contrast. COMPARISON:  Chest CTA 07/27/2019, 08/01/2019 FINDINGS: Cardiovascular: Normal heart size. No pericardial effusion. Atherosclerotic plaque within the normal caliber aorta. Normal 3 vessel branching of the aortic arch. Proximal great vessels are unremarkable. Central pulmonary arteries are normal caliber. Luminal evaluation precluded in the absence of contrast media. Mediastinum/Nodes: Subcentimeter low-attenuation mediastinal nodes are present. Axillary adenopathy. Hilar lymph nodes are difficult to assess in the absence of contrast media. 16 no acute  abnormality of the trachea or esophagus Lungs/Pleura: Trace residual left effusion. Increasing complexity of the right pleural effusion with appearance worrisome for developing loculation. Fluid is seen tracking along the right major fissure as well. Minimally along the minor fissure. Bandlike areas of opacity in the lung bases, right greater than left likely reflect areas of subsegmental atelectasis with additional partial atelectasis in the right lower lobe. Underlying consolidation is not fully excluded. Upper Abdomen: Patient is post cholecystectomy no acute abnormalities present in the visualized portions of the upper abdomen. Musculoskeletal: Few small masses noted in the right breast, largest  measuring up to 1 cm. Background of heterogeneously dense breast tissue. No acute osseous abnormality. Multilevel degenerative changes are present in the imaged portions of the spine. No acute osseous abnormality or suspicious osseous lesion. IMPRESSION: 1. Features worrisome for worsening loculation of the right pleural effusion. 2. Bandlike areas of opacity in the lung bases, right greater than left, likely reflect areas of subsegmental atelectasis with additional partial atelectasis in the right lower lobe. Underlying consolidation is not fully excluded. 3. Trace residual left effusion. 4. Few small masses in the right breast, largest measuring up to 1 cm. Correlate with mammography. 5.  Aortic Atherosclerosis (ICD10-I70.0). Electronically Signed   By: Lovena Le M.D.   On: 08/07/2019 19:40   CT Angio Chest PE W/Cm &/Or Wo Cm  Result Date: 08/01/2019 CLINICAL DATA:  64 year old female with shortness of breath, recent lower extremity swelling, status post appendectomy on Christmas day. Bilateral pleural effusions on CTA chest 5 days ago. EXAM: CT ANGIOGRAPHY CHEST CT ABDOMEN AND PELVIS WITH CONTRAST TECHNIQUE: Multidetector CT imaging of the chest was performed using the standard protocol during bolus  administration of intravenous contrast. Multiplanar CT image reconstructions and MIPs were obtained to evaluate the vascular anatomy. Multidetector CT imaging of the abdomen and pelvis was performed using the standard protocol during bolus administration of intravenous contrast. CONTRAST:  143mL OMNIPAQUE IOHEXOL 350 MG/ML SOLN COMPARISON:  Chest CTA 07/27/2019. CT Abdomen and Pelvis 05/08/2018. FINDINGS: CTA CHEST FINDINGS Cardiovascular: Good contrast bolus timing in the pulmonary arterial tree. No focal filling defect identified in the pulmonary arteries to suggest acute pulmonary embolism. No cardiomegaly or pericardial effusion. Negative visible aorta. Mediastinum/Nodes: No mediastinal lymphadenopathy. Lungs/Pleura: Right pleural effusion persists, remains small to moderate in size but is now partially loculated in the lower lobe (series 4, image 64). Small left pleural effusion has decreased but not resolved. There is associated enhancing compressive atelectasis at both lung bases. Major airways remain patent. A degree of bilateral perihilar bronchiectasis is stable. No other abnormal pulmonary opacity. Musculoskeletal: No acute osseous abnormality identified. Review of the MIP images confirms the above findings. CT ABDOMEN and PELVIS FINDINGS Hepatobiliary: Surgically absent gallbladder. Negative liver. Pancreas: Negative. Spleen: Negative. Adrenals/Urinary Tract: Normal adrenal glands. Bilateral renal enhancement and contrast excretion is symmetric and normal. Normal proximal ureters. Urinary bladder is diminutive and remains normal. Stomach/Bowel: Mild presacral stranding. Negative rectum. Sigmoid diverticulosis and redundancy. Diverticulosis continues to the splenic flexure. No definite active inflammation in those segments. The transverse colon is completely decompressed. Asymmetric mesenteric stranding at the hepatic flexure and along the ascending colon but mild if any associated bowel wall thickening.  Sequelae of appendectomy. Decompressed terminal ileum. No oral contrast was administered. There are scattered fluid-filled but only mildly dilated small bowel loops in the lower abdomen and pelvis. There are several possible small organized extraluminal fluid collections, including a small 15 millimeter possible intra loop collection amongst matted small bowel loops on coronal image 21, and possibly of small 23 millimeter collection in the deep pelvis on coronal image 37. No free air. Proximal small bowel loops are more normal. Negative stomach. Mildly dilated proximal duodenum. Vascular/Lymphatic: Mild Aortoiliac calcified atherosclerosis. Major arterial structures remain patent. Patent portal venous system. No lymphadenopathy. Reproductive: Surgically absent uterus. Adnexa remain within normal limits. Other: No pelvic free fluid. Musculoskeletal: Chronic L5 pars fractures and L5-S1 spondylolisthesis with disc and posterior element degeneration. No acute osseous abnormality identified. Review of the MIP images confirms the above findings. IMPRESSION: 1. Negative for acute pulmonary  embolus. 2. Small to moderate right pleural effusions is now partially loculated in the right lower lobe. Small left pleural effusion has decreased but not resolved. Associated compressive atelectasis at both lung bases. 3. Status post appendectomy. There is mild inflammatory stranding along the right colon, but no evidence of bowel obstruction or perforation. Difficult to exclude one or two small 15-20 mm diameter abscesses in the lower abdomen and pelvis in the absence of oral contrast. A repeat CT Abdomen and Pelvis with oral and IV contrast is recommended if fever abdominal pain persists. 4. No other acute or inflammatory process identified in the chest abdomen or pelvis. Chronic L5 pars fractures and L5-S1 spondylolisthesis. Aortic Atherosclerosis (ICD10-I70.0). Electronically Signed   By: Genevie Ann M.D.   On: 08/01/2019 20:07   CT  Angio Chest PE W/Cm &/Or Wo Cm  Result Date: 07/27/2019 CLINICAL DATA:  Shortness of breath and leg swelling for the past 2 days. Recent appendectomy on Christmas day. EXAM: CT ANGIOGRAPHY CHEST WITH CONTRAST TECHNIQUE: Multidetector CT imaging of the chest was performed using the standard protocol during bolus administration of intravenous contrast. Multiplanar CT image reconstructions and MIPs were obtained to evaluate the vascular anatomy. CONTRAST:  173mL OMNIPAQUE IOHEXOL 350 MG/ML SOLN COMPARISON:  Chest x-ray from yesterday. FINDINGS: Cardiovascular: Satisfactory opacification of the pulmonary arteries to the segmental level. No evidence of pulmonary embolism. Normal heart size. No pericardial effusion. No thoracic aortic aneurysm or dissection. Mediastinum/Nodes: No enlarged mediastinal, hilar, or axillary lymph nodes. Thyroid gland, trachea, and esophagus demonstrate no significant findings. Lungs/Pleura: Small to moderate bilateral pleural effusions with associated dependent atelectasis in both lower lobes. No consolidation or pneumothorax. No suspicious pulmonary nodule. Upper Abdomen: No acute abnormality. Status post cholecystectomy. Musculoskeletal: 10 x 9 mm nodule in the right breast. No acute or significant osseous findings. Review of the MIP images confirms the above findings. IMPRESSION: 1. No evidence of pulmonary embolism. 2. Small to moderate bilateral pleural effusions with associated dependent atelectasis in both lower lobes. 3. 10 x 9 mm nodule in the right breast. Correlation with mammography is recommended. Electronically Signed   By: Titus Dubin M.D.   On: 07/27/2019 12:45   CT Abdomen Pelvis W Contrast  Result Date: 08/01/2019 CLINICAL DATA:  64 year old female with shortness of breath, recent lower extremity swelling, status post appendectomy on Christmas day. Bilateral pleural effusions on CTA chest 5 days ago. EXAM: CT ANGIOGRAPHY CHEST CT ABDOMEN AND PELVIS WITH CONTRAST  TECHNIQUE: Multidetector CT imaging of the chest was performed using the standard protocol during bolus administration of intravenous contrast. Multiplanar CT image reconstructions and MIPs were obtained to evaluate the vascular anatomy. Multidetector CT imaging of the abdomen and pelvis was performed using the standard protocol during bolus administration of intravenous contrast. CONTRAST:  143mL OMNIPAQUE IOHEXOL 350 MG/ML SOLN COMPARISON:  Chest CTA 07/27/2019. CT Abdomen and Pelvis 05/08/2018. FINDINGS: CTA CHEST FINDINGS Cardiovascular: Good contrast bolus timing in the pulmonary arterial tree. No focal filling defect identified in the pulmonary arteries to suggest acute pulmonary embolism. No cardiomegaly or pericardial effusion. Negative visible aorta. Mediastinum/Nodes: No mediastinal lymphadenopathy. Lungs/Pleura: Right pleural effusion persists, remains small to moderate in size but is now partially loculated in the lower lobe (series 4, image 64). Small left pleural effusion has decreased but not resolved. There is associated enhancing compressive atelectasis at both lung bases. Major airways remain patent. A degree of bilateral perihilar bronchiectasis is stable. No other abnormal pulmonary opacity. Musculoskeletal: No acute osseous abnormality identified. Review  of the MIP images confirms the above findings. CT ABDOMEN and PELVIS FINDINGS Hepatobiliary: Surgically absent gallbladder. Negative liver. Pancreas: Negative. Spleen: Negative. Adrenals/Urinary Tract: Normal adrenal glands. Bilateral renal enhancement and contrast excretion is symmetric and normal. Normal proximal ureters. Urinary bladder is diminutive and remains normal. Stomach/Bowel: Mild presacral stranding. Negative rectum. Sigmoid diverticulosis and redundancy. Diverticulosis continues to the splenic flexure. No definite active inflammation in those segments. The transverse colon is completely decompressed. Asymmetric mesenteric stranding  at the hepatic flexure and along the ascending colon but mild if any associated bowel wall thickening. Sequelae of appendectomy. Decompressed terminal ileum. No oral contrast was administered. There are scattered fluid-filled but only mildly dilated small bowel loops in the lower abdomen and pelvis. There are several possible small organized extraluminal fluid collections, including a small 15 millimeter possible intra loop collection amongst matted small bowel loops on coronal image 21, and possibly of small 23 millimeter collection in the deep pelvis on coronal image 37. No free air. Proximal small bowel loops are more normal. Negative stomach. Mildly dilated proximal duodenum. Vascular/Lymphatic: Mild Aortoiliac calcified atherosclerosis. Major arterial structures remain patent. Patent portal venous system. No lymphadenopathy. Reproductive: Surgically absent uterus. Adnexa remain within normal limits. Other: No pelvic free fluid. Musculoskeletal: Chronic L5 pars fractures and L5-S1 spondylolisthesis with disc and posterior element degeneration. No acute osseous abnormality identified. Review of the MIP images confirms the above findings. IMPRESSION: 1. Negative for acute pulmonary embolus. 2. Small to moderate right pleural effusions is now partially loculated in the right lower lobe. Small left pleural effusion has decreased but not resolved. Associated compressive atelectasis at both lung bases. 3. Status post appendectomy. There is mild inflammatory stranding along the right colon, but no evidence of bowel obstruction or perforation. Difficult to exclude one or two small 15-20 mm diameter abscesses in the lower abdomen and pelvis in the absence of oral contrast. A repeat CT Abdomen and Pelvis with oral and IV contrast is recommended if fever abdominal pain persists. 4. No other acute or inflammatory process identified in the chest abdomen or pelvis. Chronic L5 pars fractures and L5-S1 spondylolisthesis. Aortic  Atherosclerosis (ICD10-I70.0). Electronically Signed   By: Genevie Ann M.D.   On: 08/01/2019 20:07   DG Chest Port 1 View  Result Date: 08/07/2019 CLINICAL DATA:  64 year old female with history of pleural effusion. EXAM: PORTABLE CHEST 1 VIEW COMPARISON:  Chest x-ray 08/01/2019. FINDINGS: Moderate right and small left pleural effusions. Decreased lung volumes. Bibasilar opacities which may reflect areas of atelectasis and/or consolidation. No pneumothorax. No definite suspicious appearing pulmonary nodules or masses are noted. No evidence of pulmonary edema. Heart size is normal. Upper mediastinal contours are within normal limits. IMPRESSION: 1. Moderate right and small left pleural effusions with bibasilar opacities favored to reflect areas of subsegmental atelectasis. Electronically Signed   By: Vinnie Langton M.D.   On: 08/07/2019 11:01    Microbiology: Recent Results (from the past 240 hour(s))  Respiratory Panel by RT PCR (Flu A&B, Covid) - Nasopharyngeal Swab     Status: None   Collection Time: 08/01/19  5:45 PM   Specimen: Nasopharyngeal Swab  Result Value Ref Range Status   SARS Coronavirus 2 by RT PCR NEGATIVE NEGATIVE Final    Comment: (NOTE) SARS-CoV-2 target nucleic acids are NOT DETECTED. The SARS-CoV-2 RNA is generally detectable in upper respiratoy specimens during the acute phase of infection. The lowest concentration of SARS-CoV-2 viral copies this assay can detect is 131 copies/mL. A negative result does  not preclude SARS-Cov-2 infection and should not be used as the sole basis for treatment or other patient management decisions. A negative result may occur with  improper specimen collection/handling, submission of specimen other than nasopharyngeal swab, presence of viral mutation(s) within the areas targeted by this assay, and inadequate number of viral copies (<131 copies/mL). A negative result must be combined with clinical observations, patient history, and  epidemiological information. The expected result is Negative. Fact Sheet for Patients:  PinkCheek.be Fact Sheet for Healthcare Providers:  GravelBags.it This test is not yet ap proved or cleared by the [place] FDA and  has been authorized for detection and/or diagnosis of SARS-CoV-2 by FDA under an Emergency Use Authorization (EUA). This EUA will remain  in effect (meaning this test can be used) for the duration of the COVID-19 declaration under Section 564(b)(1) of the Act, 21 U.S.C. section 360bbb-3(b)(1), unless the authorization is terminated or revoked sooner.    Influenza A by PCR NEGATIVE NEGATIVE Final   Influenza B by PCR NEGATIVE NEGATIVE Final    Comment: (NOTE) The Xpert Xpress SARS-CoV-2/FLU/RSV assay is intended as an aid in  the diagnosis of influenza from Nasopharyngeal swab specimens and  should not be used as a sole basis for treatment. Nasal washings and  aspirates are unacceptable for Xpert Xpress SARS-CoV-2/FLU/RSV  testing. Fact Sheet for Patients: PinkCheek.be Fact Sheet for Healthcare Providers: GravelBags.it This test is not yet approved or cleared by the [place] FDA and  has been authorized for detection and/or diagnosis of SARS-CoV-2 by  FDA under an Emergency Use Authorization (EUA). This EUA will remain  in effect (meaning this test can be used) for the duration of the  Covid-19 declaration under Section 564(b)(1) of the Act, 21  U.S.C. section 360bbb-3(b)(1), unless the authorization is  terminated or revoked. Performed at [facility], [place] [address], [place], [place] [address]   SARS CORONAVIRUS 2 (TAT 6-24 HRS) Nasopharyngeal Nasopharyngeal Swab     Status: None   Collection Time: 08/06/19 10:22 PM   Specimen: Nasopharyngeal Swab  Result Value Ref Range Status   SARS Coronavirus 2 NEGATIVE NEGATIVE Final     Comment: (NOTE) SARS-CoV-2 target nucleic acids are NOT DETECTED. The SARS-CoV-2 RNA is generally detectable in upper and lower respiratory specimens during the acute phase of infection. Negative results do not preclude SARS-CoV-2 infection, do not rule out co-infections with other pathogens, and should not be used as the sole basis for treatment or other patient management decisions. Negative results must be combined with clinical observations, patient history, and epidemiological information. The expected result is Negative. Fact Sheet for Patients: SugarRoll.be Fact Sheet for Healthcare Providers: https://www.woods-mathews.com/ This test is not yet approved or cleared by the [place] FDA and  has been authorized for detection and/or diagnosis of SARS-CoV-2 by FDA under an Emergency Use Authorization (EUA). This EUA will remain  in effect (meaning this test can be used) for the duration of the COVID-19 declaration under Section 56 4(b)(1) of the Act, 21 U.S.C. section 360bbb-3(b)(1), unless the authorization is terminated or revoked sooner. Performed at [facility], [place] [address], [place], [place] [address]   Body fluid culture (includes gram stain)     Status: None (Preliminary result)   Collection Time: 08/07/19 10:09 AM   Specimen: Pleural Fluid  Result Value Ref Range Status   Specimen Description PLEURAL  Final   Special Requests NONE  Final   Gram Stain   Final  FEW WBC PRESENT,BOTH PMN AND MONONUCLEAR NO ORGANISMS SEEN    Culture   Final    NO GROWTH < 24 HOURS Performed at [place] [address], [place], [place] [address]    Report Status PENDING  Incomplete     Labs: Basic Metabolic Panel: Recent Labs  Lab 08/01/19 1744 08/07/19 0350  NA 134* 136  K 3.6 3.9  CL 97* 100  CO2 24 26  GLUCOSE 125* 120*  BUN 10 9  CREATININE 0.59 0.56  CALCIUM 9.0 8.7*   Liver Function Tests: Recent Labs   Lab 08/01/19 1744 08/07/19 0350  AST 20 36  ALT 17 19  ALKPHOS 94 87  BILITOT 0.6 0.3  PROT 7.7 5.8*  ALBUMIN 3.5 2.5*   Recent Labs  Lab 08/01/19 1744  LIPASE 49   No results for input(s): AMMONIA in the last 168 hours. CBC: Recent Labs  Lab 08/01/19 1744 08/07/19 0350  WBC 11.4* 6.5  NEUTROABS 7.4 3.6  HGB 13.1 10.9*  HCT 41.7 33.3*  MCV 93.9 91.7  PLT 636* 467*   Cardiac Enzymes: No results for input(s): CKTOTAL, CKMB, CKMBINDEX, TROPONINI in the last 168 hours. BNP: BNP (last 3 results) Recent Labs    07/27/19 0910  BNP 54.1    ProBNP (last 3 results) No results for input(s): PROBNP in the last 8760 hours.  CBG: No results for input(s): GLUCAP in the last 168 hours.     Signed:  Cristal Deer, MD [facility] 08/08/2019, 12:20 PM

## 2019-08-09 LAB — CYTOLOGY - NON PAP

## 2019-08-10 ENCOUNTER — Other Ambulatory Visit: Payer: Self-pay

## 2019-08-10 ENCOUNTER — Ambulatory Visit (INDEPENDENT_AMBULATORY_CARE_PROVIDER_SITE_OTHER): Payer: 59 | Admitting: Internal Medicine

## 2019-08-10 DIAGNOSIS — J90 Pleural effusion, not elsewhere classified: Secondary | ICD-10-CM

## 2019-08-10 DIAGNOSIS — R0602 Shortness of breath: Secondary | ICD-10-CM

## 2019-08-10 LAB — BODY FLUID CULTURE: Culture: NO GROWTH

## 2019-08-10 LAB — PH, BODY FLUID: pH, Body Fluid: 7.7

## 2019-08-10 MED ORDER — PREDNISONE 20 MG PO TABS: 20.0000 mg | ORAL_TABLET | Freq: Every day | ORAL | 0 refills | Status: DC

## 2019-08-10 NOTE — Patient Instructions (Signed)
Prednisone 20mg  x 1 week Call in 1 week if still not improving 4 week tentative f/u by phone with Dr. Shearon Stalls

## 2019-08-10 NOTE — Progress Notes (Signed)
Visit done virutally due to COVID pandemic and with patient agreement.  Pulmonary f/u note  S: Here for f/u of symptomatic inflammatory right pleural effusion in setting of perforated appendicitis requiring washout in Dec 2020 at [facility] 1/16 with relief in breathlessness but continued right pleurisy.    Post tap CXR and CT does show some small R pleural collection remaining which is loculated.  CT of abdomen showed some residual fluid collections in abdomen that were treated with a course of cipro/flagyl.  Overall just feels fatigued more than SOB with ambulation.  Her pleurisy is controlled with NSAIDs, very slowly getting better but very bothersome.  O: Sounds well appearing on phone, speaking in full sentences with good insight.  Pleural fluid is exudative, cytology negative with abundant inflammatory cells.  Culture negative. CXR/CT results as above  A:  # Postoperative inflammatory pleural fluid collection mostly drained by thoracentesis- residual pleurisy is bothersome to patient but seems to be slowly getting better.  Degree of pain is out of proportion to her imaging findings.  P: - Give some more time, 1 week of low dose steroids - If she's still feeling lousy or languishes after a week I told her to come in for another CXR and in person visit - Ultimately if she has persistent pleurisy symptoms I guess we could refer for VATS and decortication (vs intercostal block?) but would prefer to avoid and just let remaining fluid scar down - Tentative 4 week f/u by phone - Will route to Dr. Shearon Stalls

## 2019-08-18 ENCOUNTER — Other Ambulatory Visit: Payer: Self-pay

## 2019-08-18 ENCOUNTER — Ambulatory Visit
Admission: RE | Admit: 2019-08-18 | Discharge: 2019-08-18 | Disposition: A | Discharge status: Home / Self Care | Payer: 59 | Source: Ambulatory Visit | Attending: Internal Medicine | Admitting: Internal Medicine

## 2019-08-18 ENCOUNTER — Ambulatory Visit: Payer: 59

## 2019-08-18 DIAGNOSIS — N6310 Unspecified lump in the right breast, unspecified quadrant: Secondary | ICD-10-CM

## 2019-08-28 ENCOUNTER — Ambulatory Visit: Payer: 59 | Attending: Internal Medicine

## 2019-08-28 DIAGNOSIS — Z23 Encounter for immunization: Secondary | ICD-10-CM | POA: Insufficient documentation

## 2019-08-28 NOTE — Progress Notes (Signed)
   Covid-19 Vaccination Clinic  Name:  Stacey Calhoun    MRN: [id number] DOB: 1955-10-17  08/28/2019  [name] was observed post Covid-19 immunization for 15 minutes without incidence. She was provided with Vaccine Information Sheet and instruction to access the V-Safe system.   [name] was instructed to call 911 with any severe reactions post vaccine: Marland Kitchen Difficulty breathing  . Swelling of your face and throat  . A fast heartbeat  . A bad rash all over your body  . Dizziness and weakness    Immunizations Administered    Name Date Dose VIS Date Route   Pfizer COVID-19 Vaccine 08/28/2019  3:30 PM 0.3 mL 07/02/2019 Intramuscular   Manufacturer: [place]   Lot: [id number]   [place]: [id number]

## 2019-09-07 ENCOUNTER — Other Ambulatory Visit: Payer: Self-pay

## 2019-09-07 ENCOUNTER — Encounter: Payer: Self-pay | Admitting: Internal Medicine

## 2019-09-07 ENCOUNTER — Ambulatory Visit (INDEPENDENT_AMBULATORY_CARE_PROVIDER_SITE_OTHER): Payer: 59 | Admitting: Internal Medicine

## 2019-09-07 VITALS — BP 122/76 | HR 80 | Temp 97.4°F | Ht 63.5 in | Wt 132.8 lb

## 2019-09-07 DIAGNOSIS — J90 Pleural effusion, not elsewhere classified: Secondary | ICD-10-CM

## 2019-09-07 NOTE — Progress Notes (Signed)
Stacey Calhoun    [id number]    1956/04/02  Primary Care Physician:Perini, Elta Guadeloupe, MD Date of Appointment: 09/07/2019 Established Patient Visit  Chief complaint:   Chief Complaint  Patient presents with  . Follow-up    "strange breathing pattern"     HPI: Is a 64 year old woman who initially presented to see me about a month ago for persistent right-sided pain, and was found to have a persistent loculated pleural effusion felt to be inflammatory in nature after emergency surgery for appendicitis.  Interval Updates: She was admitted to the hospital and had thoracentesis done inpatient.  Note up with Dr. Tamala Julian in clinic shortly thereafter and was having persistent pain.  She presents today for follow-up.  Overall feeling much better since follow-up from hospital.  She does not feel like her breath occasionally catches when she takes a deep breath in.  I have reviewed the patient's family social and past medical history and updated as appropriate.   Past Medical History:  Diagnosis Date  . Diverticulitis   . Fibroid   . IBS (irritable bowel syndrome)   . Interstitial cystitis   . Menorrhagia   . Migraines   . Osteoporosis    on reclast  . Thyroid nodule    u/s 2011 neg    Past Surgical History:  Procedure Laterality Date  . APPENDECTOMY    . BREAST SURGERY  [id number]   fibroadenoma in rt & left breast  . CHOLECYSTECTOMY    . TONSILLECTOMY    . VAGINAL HYSTERECTOMY  2005    Family History  Problem Relation Age of Onset  . Hypertension Mother   . Osteoporosis Mother   . Stroke Mother   . Thyroid disease Mother   . Cancer Father        bladder  . Hypertension Father   . Stroke Father   . Hypertension Sister   . Hypertension Brother   . Other Brother        Factor 3  . Diabetes Maternal Grandmother   . Autoimmune disease Neg Hx     Social History   Occupational History  . Not on file  Tobacco Use  . Smoking status: Never Smoker  .  Smokeless tobacco: Never Used  Substance and Sexual Activity  . Alcohol use: No  . Drug use: No  . Sexual activity: Yes    Partners: Male    Birth control/protection: Surgical    Comment: hysterectomy    Review of systems: Constitutional: no fevers, chills, night sweats, or weight loss. CV: No chest pain, or palpitations. Resp: No hemoptysis.  Physical Exam: Blood pressure 122/76, pulse 80, temperature (!) 97.4 F (36.3 C), temperature source Temporal, height 5' 3.5" (1.613 m), weight 132 lb 12.8 oz (60.2 kg), last menstrual period 07/23/2003, SpO2 100 %.  Gen:      No acute distress Lungs:    No increased respiratory effort, symmetric chest wall excursion, clear to auscultation bilaterally, no wheezes or crackles, good aeration in the right lung base CV:         Regular rate and rhythm; no murmurs, rubs, or gallops.  No pedal edema Neuro: normal speech, no focal facial asymmetry Psych: alert and oriented x3, normal mood and affect  Data Reviewed: Imaging: Personally reviewed and interpreted the CT chest performed which shows loculated right pleural effusion  Labs: I have personally reviewed and interpreted the pleural fluid studies which demonstrate exudative effusion based on light's criteria  for protein, with slight neutrophil predominance. Gram stain and cultures show WBCs with no growth  Cytopathology demonstrated mixed acute and chronic inflammation, no malignant cells.  Immunization status: Immunization History  Administered Date(s) Administered  . Influenza, High Dose Seasonal PF 05/06/2019  . Influenza,inj,Quad PF,6+ Mos 05/02/2018  . PFIZER SARS-COV-2 Vaccination 08/28/2019  . Zoster Recombinat (Shingrix) 07/18/2017    Assessment:  Loculated pleural effusion, likely postinflammatory  Plan/Recommendations: Her effusion is resolved. Her pain is also resolved. She has some occasionally catching when she takes a deep breath in which I hope will improve with  activity.  If she has persistent dyspnea she can return to care and we can discuss PFTs and referral to thoracic surgery if repeat imaging dictates.    I spent 31 minutes on 09/07/2019 in care of this patient including face to face time and non-face to face time spent charting, review of outside records, and coordination of care.  Return to Care: As needed  Lenice Llamas, MD Pulmonary and [place]

## 2019-09-22 ENCOUNTER — Ambulatory Visit: Payer: 59 | Attending: Internal Medicine

## 2019-09-22 DIAGNOSIS — Z23 Encounter for immunization: Secondary | ICD-10-CM | POA: Insufficient documentation

## 2019-09-22 NOTE — Progress Notes (Signed)
   Covid-19 Vaccination Clinic  Name:  Stacey Calhoun    MRN: [id number] DOB: 02-02-1956  09/22/2019  Stacey Calhoun was observed post Covid-19 immunization for 15 minutes without incident. She was provided with Vaccine Information Sheet and instruction to access the V-Safe system.   Stacey Calhoun was instructed to call 911 with any severe reactions post vaccine: Marland Kitchen Difficulty breathing  . Swelling of face and throat  . A fast heartbeat  . A bad rash all over body  . Dizziness and weakness   Immunizations Administered    Name Date Dose VIS Date Route   Pfizer COVID-19 Vaccine 09/22/2019 11:49 AM 0.3 mL 07/02/2019 Intramuscular   Manufacturer: [place]   Lot: [id number]   [place]: [id number]

## 2019-10-13 ENCOUNTER — Encounter: Payer: Self-pay | Admitting: Certified Nurse Midwife

## 2020-01-19 ENCOUNTER — Other Ambulatory Visit: Payer: Self-pay

## 2020-01-19 ENCOUNTER — Ambulatory Visit
Admission: RE | Admit: 2020-01-19 | Discharge: 2020-01-19 | Disposition: A | Discharge status: Home / Self Care | Payer: 59 | Source: Ambulatory Visit | Attending: Internal Medicine | Admitting: Internal Medicine

## 2020-01-19 ENCOUNTER — Other Ambulatory Visit: Payer: Self-pay | Admitting: Internal Medicine

## 2020-01-19 DIAGNOSIS — R0600 Dyspnea, unspecified: Secondary | ICD-10-CM

## 2020-01-19 DIAGNOSIS — R0781 Pleurodynia: Secondary | ICD-10-CM

## 2020-01-19 DIAGNOSIS — D682 Hereditary deficiency of other clotting factors: Secondary | ICD-10-CM

## 2020-01-19 DIAGNOSIS — R002 Palpitations: Secondary | ICD-10-CM

## 2020-01-19 MED ORDER — IOPAMIDOL (ISOVUE-370) INJECTION 76%
75.0000 mL | Freq: Once | INTRAVENOUS | Status: AC | PRN
  Administered 2020-01-19: 75 mL via INTRAVENOUS

## 2020-01-26 ENCOUNTER — Other Ambulatory Visit: Payer: Self-pay

## 2020-01-26 ENCOUNTER — Telehealth: Payer: 59 | Admitting: Acute Care

## 2020-01-26 DIAGNOSIS — R0602 Shortness of breath: Secondary | ICD-10-CM

## 2020-01-26 NOTE — Patient Instructions (Signed)
It is good to speak with you . We will order PFT's  Someone will call to get this scheduled. Please bring your Covid Vaccination  Card.  We will schedule you for an in office follow up with Physical exam once PFT's are done with Dr. Shearon Stalls or another provider.  Follow up with cardiology as is scheduled  Please see if you can purchase a oxygen saturation monitor . Goal is always greater than 88% Please contact office for sooner follow up if symptoms do not improve or worsen or seek emergency care

## 2020-01-26 NOTE — Progress Notes (Signed)
Virtual Visit via Telephone Note  I connected with Stacey Calhoun on 01/26/20 at  3:00 PM EDT by telephone and verified that I am speaking with the correct person using two identifiers.  Location: Patient: At home Provider:  [place], [place], [place], [address]   I discussed the limitations, risks, security and privacy concerns of performing an evaluation and management service by telephone and the availability of in person appointments. I also discussed with the patient that there may be a patient responsible charge related to this service. The patient expressed understanding and agreed to proceed.   History of Present Illness: Pt.presents for tele visit. Pt. States she is having trouble breathing. States she cannot take a deep breath. States she has heavy chest , feels like she has a band around her. She has had a dry cough for 1.5 months. She is on prednisone x 1 month. She states this has helped with her cough. Tomorrow is the last day on prednisone. Nobody has treated the pain in her right back  Ribs.Cough is dry, tickle cough. Last antibiotic was 5 weeks ago, amoxicillin. 11/2019 Sinus infection treated with ABX and got better. CXR showed bronchitis, and she was treated with prednisone Bronchitis treated >> no improvement >> chest still heavy QVAR>> Did not help after 2 weeks CT scan last week. No blood clot She had a CT Angio There was no blood clot Did show scarring and linear atelectasis  She has not been breathing normally x 5-6 weeks She has evaluation by cardiology next week.  She fell a few weeks ago. ( 6/16) She was carrying a shower chair into the shower, and she fell into her husbands hospital bed. She states she  hit underneath her left arm and around to her back.She had already been having chest heaviness at this time, so she does not feel this is the cause of her current issues.  She does have some sharp pains underneath left arm and breast She is  unable to take ibuprofen as it hurts her stomach.  She states her pain has gotten no better after prednisone.  She states she does have some heart racing. Races for a few minutes and then stops. No correlation with chest heaviness and heart rate.  Per patient  No labs Troponins EKG was done and looks " fine". Cannot take Nsaids      Observations/Objective:   Assessment and Plan:   Follow Up Instructions:    I discussed the assessment and treatment plan with the patient. The patient was provided an opportunity to ask questions and all were answered. The patient agreed with the plan and demonstrated an understanding of the instructions.   The patient was advised to call back or seek an in-person evaluation if the symptoms worsen or if the condition fails to improve as anticipated.  I provided *** minutes of non-face-to-face time during this encounter.   Magdalen Spatz, NP

## 2020-01-28 ENCOUNTER — Emergency Department (HOSPITAL_BASED_OUTPATIENT_CLINIC_OR_DEPARTMENT_OTHER): Payer: 59

## 2020-01-28 ENCOUNTER — Emergency Department (HOSPITAL_COMMUNITY): Payer: 59

## 2020-01-28 ENCOUNTER — Emergency Department (HOSPITAL_BASED_OUTPATIENT_CLINIC_OR_DEPARTMENT_OTHER)
Admission: EM | Admit: 2020-01-28 | Discharge: 2020-01-28 | Disposition: A | Discharge status: Home / Self Care | Payer: 59 | Attending: Emergency Medicine | Admitting: Emergency Medicine

## 2020-01-28 ENCOUNTER — Other Ambulatory Visit: Payer: Self-pay

## 2020-01-28 ENCOUNTER — Encounter (HOSPITAL_BASED_OUTPATIENT_CLINIC_OR_DEPARTMENT_OTHER): Payer: Self-pay | Admitting: Emergency Medicine

## 2020-01-28 DIAGNOSIS — Z79899 Other long term (current) drug therapy: Secondary | ICD-10-CM | POA: Diagnosis not present

## 2020-01-28 DIAGNOSIS — R200 Anesthesia of skin: Secondary | ICD-10-CM | POA: Insufficient documentation

## 2020-01-28 LAB — BASIC METABOLIC PANEL
Anion gap: 11 (ref 5–15)
BUN: 17 mg/dL (ref 8–23)
CO2: 28 mmol/L (ref 22–32)
Calcium: 9.2 mg/dL (ref 8.9–10.3)
Chloride: 99 mmol/L (ref 98–111)
Creatinine, Ser: 0.65 mg/dL (ref 0.44–1.00)
GFR calc Af Amer: >60 mL/min (ref >60)
GFR calc non Af Amer: >60 mL/min (ref >60)
Glucose, Bld: 97 mg/dL (ref 70–99)
Potassium: 4 mmol/L (ref 3.5–5.1)
Sodium: 138 mmol/L (ref 135–145)

## 2020-01-28 LAB — CBC WITH DIFFERENTIAL/PLATELET
Abs Immature Granulocytes: 0.08 10*3/uL — ABNORMAL HIGH (ref 0.00–0.07)
Basophils Absolute: 0.1 10*3/uL (ref 0.0–0.1)
Basophils Relative: 1 %
Eosinophils Absolute: 0.2 10*3/uL (ref 0.0–0.5)
Eosinophils Relative: 2 %
HCT: 44.6 % (ref 36.0–46.0)
Hemoglobin: 14.1 g/dL (ref 12.0–15.0)
Immature Granulocytes: 1 %
Lymphocytes Relative: 37 %
Lymphs Abs: 3.5 10*3/uL (ref 0.7–4.0)
MCH: 29.7 pg (ref 26.0–34.0)
MCHC: 31.6 g/dL (ref 30.0–36.0)
MCV: 94.1 fL (ref 80.0–100.0)
Monocytes Absolute: 1.1 10*3/uL — ABNORMAL HIGH (ref 0.1–1.0)
Monocytes Relative: 11 %
Neutro Abs: 4.6 10*3/uL (ref 1.7–7.7)
Neutrophils Relative %: 48 %
Platelets: 404 10*3/uL — ABNORMAL HIGH (ref 150–400)
RBC: 4.74 MIL/uL (ref 3.87–5.11)
RDW: 14.1 % (ref 11.5–15.5)
WBC: 9.5 10*3/uL (ref 4.0–10.5)
nRBC: 0 % (ref 0.0–0.2)

## 2020-01-28 LAB — TROPONIN I (HIGH SENSITIVITY): Troponin I (High Sensitivity): 5 ng/L (ref <18)

## 2020-01-28 MED ORDER — GADOBUTROL 1 MMOL/ML IV SOLN
6.9000 mL | Freq: Once | INTRAVENOUS | Status: AC | PRN
  Administered 2020-01-28: 6.9 mL via INTRAVENOUS

## 2020-01-28 MED ORDER — ACETAMINOPHEN 325 MG PO TABS
650.0000 mg | ORAL_TABLET | Freq: Once | ORAL | Status: AC
  Administered 2020-01-28: 650 mg via ORAL
  Filled 2020-01-28: qty 2

## 2020-01-28 NOTE — ED Notes (Signed)
Patient verbalizes understanding of discharge instructions. Opportunity for questioning and answers were provided. Pt discharged from ED. 

## 2020-01-28 NOTE — ED Provider Notes (Signed)
[facility] EMERGENCY DEPARTMENT Provider Note  CSN: [id number] Arrival date & time: 01/28/20 6226    History Chief Complaint  Patient presents with  . Numbness    HPI  Stacey Calhoun is a 64 y.o. female with history of pleural effusion post-op after a perforated appendicitis in Jan 2021 requiring thoracentesis. She more recently has had persistent SOB, followed by PCP and Pulmonology with recent CTA showing scar vs atelectasis but no PE. She has also been having occasional bandlike tightness around her lower chest. Her main reason for coming to the ED today was for numbness in her LUE and LLE present when she woke up yesterday morning (>24hours ago) described as a dullness/heaviness. No loss of motor function or coordination. Not described as tingling or pins and needles. She has not had any facial droop, slurred speech or difficulty walking.    Past Medical History:  Diagnosis Date  . Diverticulitis   . Fibroid   . IBS (irritable bowel syndrome)   . Interstitial cystitis   . Menorrhagia   . Migraines   . Osteoporosis    on reclast  . Thyroid nodule    u/s 2011 neg    Past Surgical History:  Procedure Laterality Date  . APPENDECTOMY    . BREAST SURGERY  3335,4562   fibroadenoma in rt & left breast  . CHOLECYSTECTOMY    . TONSILLECTOMY    . VAGINAL HYSTERECTOMY  2005    Family History  Problem Relation Age of Onset  . Hypertension Mother   . Osteoporosis Mother   . Stroke Mother   . Thyroid disease Mother   . Cancer Father        bladder  . Hypertension Father   . Stroke Father   . Hypertension Sister   . Hypertension Brother   . Other Brother        Factor 3  . Diabetes Maternal Grandmother   . Autoimmune disease Neg Hx     Social History   Tobacco Use  . Smoking status: Never Smoker  . Smokeless tobacco: Never Used  Vaping Use  . Vaping Use: Never used  Substance Use Topics  . Alcohol use: No  . Drug use: No     Home  Medications Prior to Admission medications   Medication Sig Start Date End Date Taking? Authorizing Provider  buPROPion (WELLBUTRIN XL) 150 MG 24 hr tablet Take 150 mg by mouth daily. 01/05/20   [provider]  cetirizine (ZYRTEC) 10 MG tablet Take 10 mg by mouth as needed for allergies.    [provider]  Dextran 70-Hypromellose (ARTIFICIAL TEARS PF OP) Place 1 drop into both eyes daily as needed (For dry eyes).     [provider]  Melatonin 5 MG TABS Take 1 tablet by mouth at bedtime.    [provider]  pantoprazole (PROTONIX) 40 MG tablet Take 40 mg by mouth every morning. 01/19/20   [provider]  Probiotic Product (PROBIOTIC PO) Take by mouth daily.    [provider]  SUMAtriptan (IMITREX) 50 MG tablet Take 1 tablet (50 mg total) by mouth once. May repeat in 2 hours if headache persists or recurs. Patient taking differently: Take 50 mg by mouth every 2 (two) hours as needed. May repeat in 2 hours if headache persists or recurs. 08/01/15   Regina Eck, CNM     Allergies    Singulair [montelukast] and Sulfa antibiotics   Review of Systems  Review of Systems A comprehensive review of systems was completed and negative except as noted in HPI.    Physical Exam BP (!) 159/100 (BP Location: Left Arm)   Pulse 75   Temp 98 F (36.7 C) (Oral)   Resp (!) 22   Ht 5' 3.5" (1.613 m)   Wt 67.9 kg   LMP 07/23/2003   SpO2 100%   BMI 26.08 kg/m   Physical Exam Vitals and nursing note reviewed.  Constitutional:      Appearance: Normal appearance.  HENT:     Head: Normocephalic and atraumatic.     Nose: Nose normal.     Mouth/Throat:     Mouth: Mucous membranes are moist.  Eyes:     Extraocular Movements: Extraocular movements intact.     Conjunctiva/sclera: Conjunctivae normal.  Cardiovascular:     Rate and Rhythm: Normal rate.  Pulmonary:     Effort: Pulmonary effort is normal.     Breath sounds: Normal breath  sounds.  Abdominal:     General: Abdomen is flat.     Palpations: Abdomen is soft.     Tenderness: There is no abdominal tenderness.  Musculoskeletal:        General: No swelling. Normal range of motion.     Cervical back: Neck supple.  Skin:    General: Skin is warm and dry.  Neurological:     General: No focal deficit present.     Mental Status: She is alert and oriented to person, place, and time.     Cranial Nerves: No cranial nerve deficit.     Sensory: Sensory deficit (mild subjective decrease in sensation to L arm and leg to light touch) present.     Motor: No weakness.     Coordination: Coordination normal.     Gait: Gait normal.     Deep Tendon Reflexes: Reflexes normal.  Psychiatric:        Mood and Affect: Mood normal.      ED Results / Procedures / Treatments   Labs (all labs ordered are listed, but only abnormal results are displayed) Labs Reviewed  CBC WITH DIFFERENTIAL/PLATELET - Abnormal; Notable for the following components:      Result Value   Platelets 404 (*)    Monocytes Absolute 1.1 (*)    Abs Immature Granulocytes 0.08 (*)    All other components within normal limits  BASIC METABOLIC PANEL  TROPONIN I (HIGH SENSITIVITY)    EKG EKG Interpretation  Date/Time:  Friday January 28 2020 09:35:11 EDT Ventricular Rate:  81 PR Interval:    QRS Duration: 80 QT Interval:  358 QTC Calculation: 416 R Axis:   73 Text Interpretation: Sinus rhythm Atrial premature complex Since last tracing PACs are now present Confirmed by Calvert Cantor [phone]) on 01/28/2020 9:49:11 AM    Radiology DG Chest 2 View  Result Date: 01/28/2020 CLINICAL DATA:  Dyspnea, chest pain EXAM: CHEST - 2 VIEW COMPARISON:  None. FINDINGS: The heart size and mediastinal contours are within normal limits. Both lungs are clear. The visualized skeletal structures are unremarkable. IMPRESSION: No active cardiopulmonary disease. Electronically Signed   By: Fidela Salisbury MD   On: 01/28/2020  10:28   CT Head Wo Contrast  Result Date: 01/28/2020 CLINICAL DATA:  Left arm and leg numbness x2 days EXAM: CT HEAD WITHOUT CONTRAST TECHNIQUE: Contiguous axial images were obtained from the base of the skull through the vertex without intravenous contrast. COMPARISON:  None. FINDINGS: Brain: Normal anatomic configuration. There  is moderate scattered white matter changes within the periventricular white matter and centrum semiovale most in keeping with microangiopathic change. No abnormal intra or extra-axial mass lesion or fluid collection. No abnormal mass effect or midline shift. No evidence of acute intracranial hemorrhage or infarct. Ventricular size is normal. Cerebellum unremarkable. Vascular: Unremarkable Skull: Intact Sinuses/Orbits: Paranasal sinuses are clear. Orbits are unremarkable. Other: Mastoid air cells and middle ear cavities are clear. IMPRESSION: Senescent changes. No evidence of acute intracranial hemorrhage or infarct. Electronically Signed   By: Fidela Salisbury MD   On: 01/28/2020 10:34    Procedures Procedures  Medications Ordered in the ED Medications - No data to display   MDM Rules/Calculators/A&P MDM Patient with recent problems with breathing, being evaluated by Pulmonology and scheduled for outpatient PFTs. She is here today for numbness in L arm and leg. Mild decrease in sensation to light touch, otherwise no focal neuro deficits. Not a candidate for tPA given duration of symptoms. Will check labs, EKG, CXR and CT head.  ED Course  I have reviewed the triage vital signs and the nursing notes.  Pertinent labs & imaging results that were available during my care of the patient were reviewed by me and considered in my medical decision making (see chart for details).  Clinical Course as of Jan 27 1218  Fri Jan 28, 2020  1101 CXR and CT images and results reviewed. No concerning findings.     [CS]  1101 CBC is normal.    [CS]  1123 BMP is normal.    [CS]  4818  Patient still reporting some dullness on her R arm and leg. Will discuss with on-call Neurology.   [CS]  5631 Spoke with Dr. Leonel Ramsay, on call for Neurology, who recommends transfer to [facility] ED for MRI.    [CS]    Clinical Course User Index [CS] Truddie Hidden, MD    Final Clinical Impression(s) / ED Diagnoses Final diagnoses:  Numbness    Rx / DC Orders ED Discharge Orders    None       Truddie Hidden, MD 01/28/20 1219

## 2020-01-28 NOTE — ED Notes (Signed)
Pt on monitor 

## 2020-01-28 NOTE — ED Triage Notes (Signed)
Left arm and leg numbness x2 days.  Had CT this week for SOB and was found to have collapsed alveoli on right per pt.  Has spoken with Pulmonologist and pmd and has Cardiology appt in 3 days.  Sts for the past 2 days she has generalized weakness and this numbness.  No focal numbness or weakness.  Denies pain.

## 2020-01-28 NOTE — Discharge Instructions (Signed)
You have been seen today for left-sided numbness. Please read and follow all provided instructions. Return to the emergency room for worsening condition or new concerning symptoms.    Your MRIs today did not show any signs of a stroke or MS as we discussed.  1. Medications:  No new medications were prescribed today.  Continue usual home medications Take medications as prescribed. Please review all of the medicines and only take them if you do not have an allergy to them.   2. Treatment: rest, drink plenty of fluids  3. Follow Up:  Please follow up with neurology as we discussed.  A referral has been sent.  If you do not hear from the office within 1 week you can call to schedule follow-up appointment.   ?

## 2020-01-28 NOTE — ED Triage Notes (Signed)
Pt sent from med [facility] for follow up regarding numbness to L arm and L leg x 2 days; NIH 1; c/o HA 4/10; CT scan done at [facility]; 12 lead unremarkable with ems  145/92 HR 76 CBG 15 100% RA

## 2020-01-28 NOTE — ED Provider Notes (Signed)
Care assumed from Dr. Karle Starch from [facility] ED pending MR brain.  See his note for full H&P.   Briefly this is a 64 year old female presenting with chief complaint of left arm and leg numbness that is been ongoing over 24 hours.  Work-up by previous team includes unremarkable CBC and BMP, troponin 5. EKG without ischemic changes, does have new PACs compared to prior. Dr. Leonel Ramsay was consulted and recommend MR brain.  Physical Exam  BP (!) 155/74 (BP Location: Left Arm)   Pulse 71   Temp 98.5 F (36.9 C) (Oral)   Resp 18   Ht 5\' 3"  (1.6 m)   Wt 67.9 kg   LMP 07/23/2003   SpO2 100%   BMI 26.52 kg/m   Physical Exam  PE: Constitutional: well-developed, well-nourished, no apparent distress HENT: normocephalic, atraumatic. no cervical adenopathy Cardiovascular: normal rate and rhythm, distal pulses intact Pulmonary/Chest: effort normal; breath sounds clear and equal bilaterally; no wheezes or rales Abdominal: soft and nontender Musculoskeletal: full ROM, no edema Neurological: alert with goal directed thinking. CN 2-12 grossly intact. She does have mild subjective decrease in sensation of L arm and leg to sharp and dull touch. Strong and equal grip strength in bilateral upper and lower extremities.  Skin: warm and dry, no rash, no diaphoresis Psychiatric: normal mood and affect, normal behavior    ED Course/Procedures   Clinical Course as of Jan 27 1802  Fri Jan 28, 2020  1101 CXR and CT images and results reviewed. No concerning findings.     [CS]  1101 CBC is normal.    [CS]  1123 BMP is normal.    [CS]  8099 Patient still reporting some dullness on her R arm and leg. Will discuss with on-call Neurology.   [CS]  [id number] SNKNL with Dr. Leonel Ramsay, on call for Neurology, who recommends transfer to [facility] ED for MRI.    [CS]    Clinical Course User Index [CS] Truddie Hidden, MD   CT HEAD WITHOUT CONTRAST    TECHNIQUE:  Contiguous axial images were obtained from the  base of the skull  through the vertex without intravenous contrast.    COMPARISON: None.    FINDINGS:  Brain: Normal anatomic configuration. There is moderate scattered  white matter changes within the periventricular white matter and  centrum semiovale most in keeping with microangiopathic change. No  abnormal intra or extra-axial mass lesion or fluid collection. No  abnormal mass effect or midline shift. No evidence of acute  intracranial hemorrhage or infarct. Ventricular size is normal.  Cerebellum unremarkable.    Vascular: Unremarkable    Skull: Intact    Sinuses/Orbits: Paranasal sinuses are clear. Orbits are  unremarkable.    Other: Mastoid air cells and middle ear cavities are clear.    IMPRESSION:  Senescent changes. No evidence of acute intracranial hemorrhage or  infarct.      Electronically Signed  By: Fidela Salisbury MD  On: 01/28/2020 10:34   CHEST - 2 VIEW    COMPARISON: None.    FINDINGS:  The heart size and mediastinal contours are within normal limits.  Both lungs are clear. The visualized skeletal structures are  unremarkable.    IMPRESSION:  No active cardiopulmonary disease.      Electronically Signed  By: Fidela Salisbury MD  On: 01/28/2020 10:28     Results for orders placed or performed during the hospital encounter of 01/28/20 (from the past 24 hour(s))  Basic metabolic panel     Status:  None   Collection Time: 01/28/20 10:39 AM  Result Value Ref Range   Sodium 138 135 - 145 mmol/L   Potassium 4.0 3.5 - 5.1 mmol/L   Chloride 99 98 - 111 mmol/L   CO2 28 22 - 32 mmol/L   Glucose, Bld 97 70 - 99 mg/dL   BUN 17 8 - 23 mg/dL   Creatinine, Ser 0.65 0.44 - 1.00 mg/dL   Calcium 9.2 8.9 - 10.3 mg/dL   GFR calc non Af Amer >60 >60 mL/min   GFR calc Af Amer >60 >60 mL/min   Anion gap 11 5 - 15  Troponin I (High Sensitivity)     Status: None   Collection Time: 01/28/20 10:39 AM  Result Value Ref Range   Troponin I  (High Sensitivity) 5 <18 ng/L  CBC with Differential     Status: Abnormal   Collection Time: 01/28/20 10:39 AM  Result Value Ref Range   WBC 9.5 4.0 - 10.5 K/uL   RBC 4.74 3.87 - 5.11 MIL/uL   Hemoglobin 14.1 12.0 - 15.0 g/dL   HCT 44.6 36 - 46 %   MCV 94.1 80.0 - 100.0 fL   MCH 29.7 26.0 - 34.0 pg   MCHC 31.6 30.0 - 36.0 g/dL   RDW 14.1 11.5 - 15.5 %   Platelets 404 (H) 150 - 400 K/uL   nRBC 0.0 0.0 - 0.2 %   Neutrophils Relative % 48 %   Neutro Abs 4.6 1.7 - 7.7 K/uL   Lymphocytes Relative 37 %   Lymphs Abs 3.5 0.7 - 4.0 K/uL   Monocytes Relative 11 %   Monocytes Absolute 1.1 (H) 0 - 1 K/uL   Eosinophils Relative 2 %   Eosinophils Absolute 0.2 0 - 0 K/uL   Basophils Relative 1 %   Basophils Absolute 0.1 0 - 0 K/uL   Immature Granulocytes 1 %   Abs Immature Granulocytes 0.08 (H) 0.00 - 0.07 K/uL    EKG Interpretation  Date/Time:  Friday January 28 2020 09:35:11 EDT Ventricular Rate:  81 PR Interval:    QRS Duration: 80 QT Interval:  358 QTC Calculation: 416 R Axis:   73 Text Interpretation: Sinus rhythm Atrial premature complex Since last tracing PACs are now present Confirmed by Calvert Cantor [phone]) on 01/28/2020 9:49:11 AM      MRI HEAD WITHOUT CONTRAST    TECHNIQUE:  Multiplanar, multiecho pulse sequences of the brain and surrounding  structures were obtained without intravenous contrast.    COMPARISON: Head CT 01/28/2020    FINDINGS:  Brain:    Cerebral volume is normal for age.    Moderate patchy T2/FLAIR hyperintensity within the cerebral white  matter is advanced for age and nonspecific.    There is no acute infarct.    No evidence of intracranial mass.    No chronic intracranial blood products.    No extra-axial fluid collection.    No midline shift.    Vascular: Visualized orbits show no acute finding.    Skull and upper cervical spine: No focal marrow lesion.    Sinuses/Orbits: Visualized orbits show no acute finding.  Minimal  ethmoid sinus mucosal thickening. No significant mastoid effusion.    IMPRESSION:  No evidence of acute intracranial abnormality, including acute  infarction.    Moderate patchy T2 hyperintense signal abnormality within the  cerebral white matter is advanced for age and nonspecific.  Differential considerations include chronic small vessel ischemic  disease, sequelae of a prior infectious/inflammatory process,  a  demyelinating process such as multiple sclerosis and vasculitis,  among others.    Minimal ethmoid sinus mucosal thickening.      Electronically Signed  By: Kellie Simmering DO  On: 01/28/2020 15:26   MRI HEAD WITH CONTRAST    TECHNIQUE:  Multiplanar, multiecho pulse sequences of the brain and surrounding  structures were obtained with intravenous contrast.    CONTRAST: 6.33mL GADAVIST GADOBUTROL 1 MMOL/ML IV SOLN    COMPARISON: Noncontrast brain MRI performed earlier the same day  01/28/2020, head CT 01/28/2020    FINDINGS:  Brain:    A limited protocol pre and postcontrast brain MRI was performed as a  follow-up to the noncontrast brain MRI performed earlier the same  day. The current study consists of a 3D T2/FLAIR sequence as well as  axial, coronal and sagittal postcontrast T1 weighted imaging.    Unchanged multifocal T2 hyperintensity within the  subcortical/juxtacortical and deep periventricular white matter as  compared to the noncontrast brain MRI performed earlier the same  day.    No abnormal intracranial enhancement is identified.    Vascular: Expected enhancement within the proximal large arterial  vessels and dural venous sinuses.    Skull and upper cervical spine: No enhancing calvarial lesion is  identified.    Sinuses/Orbits: No evidence of acute orbital abnormality. Mild  ethmoid sinus mucosal thickening.    IMPRESSION:  Redemonstrated moderate patchy T2/FLAIR hyperintense signal  abnormality within the  cerebral white matter which is advanced for  age and nonspecific. Differential considerations include chronic  small vessel ischemic disease, a demyelinating process such as  multiple sclerosis, sequelae of a prior infectious/inflammatory  process, vasculitis and migraine headaches, among others.    No abnormal intracranial enhancement is demonstrated.    Mild ethmoid sinus mucosal thickening.      Electronically Signed  By: Kellie Simmering DO  On: 01/28/2020 17:35   MRI CERVICAL SPINE WITHOUT AND WITH CONTRAST    TECHNIQUE:  Multiplanar and multiecho pulse sequences of the cervical spine, to  include the craniocervical junction and cervicothoracic junction,  were obtained without and with intravenous contrast.    CONTRAST: 6.44mL GADAVIST GADOBUTROL 1 MMOL/ML IV SOLN    COMPARISON: Same-day brain MRI 01/28/2020.    FINDINGS:  The sagittal postcontrast T1 weighted sequence is moderately motion  degraded, which limits evaluation for abnormal cord enhancement. The  axial postcontrast T1 weighted imaging is of good quality.    Alignment: Straightening of the expected cervical lordosis. No  significant spondylolisthesis.    Vertebrae: Vertebral body height is maintained no significant marrow  edema or suspicious osseous lesion.    Cord: No spinal cord signal abnormality or abnormal cord enhancement  is identified.    Posterior Fossa, vertebral arteries, paraspinal tissues: No  abnormality identified within included portions of the posterior  fossa. Flow voids preserved within the imaged cervical vertebral  arteries. Paraspinal soft tissues within normal limits.    Disc levels:    Mild multilevel disc desiccation without significant disc height  loss.    C2-C3: No significant disc herniation or stenosis.    C3-C4: Shallow disc bulge with superimposed tiny central disc  protrusion. No significant canal or foraminal stenosis    C4-C5: Minimal disc  bulge. No significant spinal canal or foraminal  stenosis.    C5-C6: No significant disc herniation or stenosis.    C6-C7: No significant disc herniation or stenosis.    C7-T1: No significant disc herniation or stenosis.    IMPRESSION:  The sagittal T1 weighted post-contrast sequence is moderately motion  degraded, somewhat limiting evaluation for abnormal spinal cord  enhancement. However, the axial T1 weighted post-contrast imaging is  of good quality.    No spinal cord signal abnormality or abnormal cord enhancement is  identified.    Mild cervical spondylosis. No significant spinal canal or foraminal  narrowing at any level.      Electronically Signed  By: Kellie Simmering DO  On: 01/28/2020 17:43     MDM  Patient received in transfer from [place] for MR brain at 2:02 PM.  Patient evaluated on arrival.  She is continuing to report left-sided heaviness in arm and leg.  Strong and equal grip strength in bilateral upper and lower extremities.  Patient does admit to a mild headache currently.  She states has progressively worsened since onset.  Will give p.o. Tylenol.   MR brain is negative for stroke.  Radiologist does comment on moderate patchy T2 hyperintense signal abnormality.  I consulted neurology and discussed case with Dr. Leonel Ramsay.  He viewed images and recommends MR brain with contrast to determine if new onset MS.  Reconsulted neurology after MR brain with contrast and MR cervical and as there is no abnormal intracranial enhancement identified, patient can be discharged home to follow up with neurology outpatient. Patient updated on results and is agreeable with plan of care.  Ambulatory referral sent for neurology.  She is stable at time of disposition.  Strict return precautions discussed.   Portions of this note were generated with [employer]. Dictation errors may occur despite best attempts at proofreading.    Cherre Robins,  PA-C 01/28/20 1803    Noemi Chapel, MD 01/29/20 1018

## 2020-01-30 NOTE — Progress Notes (Signed)
Cardiology Office Note   Date:  01/31/2020   ID:  Stacey Calhoun, DOB 1955-08-12, MRN [id number]  PCP:  Stacey Infante, MD  Cardiologist:   No primary care provider on file. Referring:  Stacey Infante, MD  Chief Complaint  Patient presents with  . Shortness of Breath      History of Present Illness: Stacey Calhoun is a 64 y.o. female who is referred by Stacey Infante, MD for evaluation of chest pain and SOB.   She was in the ED for this last week.  I reviewed these records for this visit.    There was no evidence of ischemia.  Trops were negative.  MRI was unremarkable.   Her symptoms that day included some left arm and leg numbness.  There was some vague chest discomfort.  She has had a constellation of symptoms.  She has had some chest discomfort that was thought possibly to be pleuritic.  It is mid chest with some radiation through to her back.  Because of a history of factor V Leiden deficiency in her family she had a CT in June demonstrated no evidence of PE.  She is also had shortness of breath.  She feels like she cannot take a deep breath.  She is scheduled to have pulmonary function testing.  She took Qvar without improvement.  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  She has had episodes of her heart racing and skipping.  I do note that when she was in the hospital not long ago there were some brief episodes of SVT noted.  However, these are not particularly problematic.  She is not having any sustained tachyarrhythmias and no presyncope or syncope.  She is not describing cough fevers or chills.  She had no PND or orthopnea.   Past Medical History:  Diagnosis Date  . Diverticulitis   . Fibroid   . IBS (irritable bowel syndrome)   . Interstitial cystitis   . Menorrhagia   . Migraines   . Osteoporosis    on reclast  . Thyroid nodule    u/s 2011 neg    Past Surgical  History:  Procedure Laterality Date  . APPENDECTOMY    . BREAST SURGERY  4098,1191   fibroadenoma in rt & left breast  . CHOLECYSTECTOMY    . TONSILLECTOMY    . VAGINAL HYSTERECTOMY  2005     Current Outpatient Medications  Medication Sig Dispense Refill  . cetirizine (ZYRTEC) 10 MG tablet Take 10 mg by mouth as needed for allergies.    . Dextran 70-Hypromellose (ARTIFICIAL TEARS PF OP) Place 1 drop into both eyes daily as needed (For dry eyes).     Marland Kitchen FLUoxetine (PROZAC) 10 MG tablet Take 10 mg by mouth daily.    . fluticasone (FLONASE) 50 MCG/ACT nasal spray Place 1 spray into both nostrils daily as needed for allergies or rhinitis.    . Melatonin 5 MG TABS Take 1 tablet by mouth at bedtime.    Marland Kitchen omeprazole (PRILOSEC) 40 MG capsule Take 40 mg by mouth daily.    . pantoprazole (PROTONIX) 40 MG tablet Take 40 mg by mouth every morning.    . Probiotic Product (PROBIOTIC PO) Take by mouth daily.    . SUMAtriptan (IMITREX) 50 MG tablet Take 1 tablet (50 mg total) by mouth once. May repeat in 2 hours if headache persists or recurs. (Patient  taking differently: Take 50 mg by mouth every 2 (two) hours as needed. May repeat in 2 hours if headache persists or recurs.) 18 tablet 2  . VITAMIN D, CHOLECALCIFEROL, PO Take by mouth.    . propranolol (INDERAL) 10 MG tablet Take 1 tablet (10 mg total) by mouth every 8 (eight) hours as needed. 90 tablet 1   No current facility-administered medications for this visit.    Allergies:   Singulair [montelukast] and Sulfa antibiotics    Social History:  The patient  reports that she has never smoked. She has never used smokeless tobacco. She reports that she does not drink alcohol and does not use drugs.   Family History:  The patient's family history includes Cancer in her father; Diabetes in her maternal grandmother; Hypertension in her brother, father, mother, and sister; Osteoporosis in her mother; Other in her brother; Stroke in her father and mother;  Thyroid disease in her mother.    ROS:  Please see the history of present illness.   Otherwise, review of systems are positive for none.   All other systems are reviewed and negative.    PHYSICAL EXAM: VS:  BP 130/86   Pulse 74   Ht 5' 3.5" (1.613 m)   Wt 137 lb 12.8 oz (62.5 kg)   LMP 07/23/2003   SpO2 100%   BMI 24.03 kg/m  , BMI Body mass index is 24.03 kg/m. GENERAL:  Well appearing HEENT:  Pupils equal round and reactive, fundi not visualized, oral mucosa unremarkable NECK:  No jugular venous distention, waveform within normal limits, carotid upstroke brisk and symmetric, no bruits, no thyromegaly LYMPHATICS:  No cervical, inguinal adenopathy LUNGS:  Clear to auscultation bilaterally BACK:  No CVA tenderness CHEST:  Unremarkable HEART:  PMI not displaced or sustained,S1 and S2 within normal limits, no S3, no S4, no clicks, no rubs, no murmurs ABD:  Flat, positive bowel sounds normal in frequency in pitch, no bruits, no rebound, no guarding, no midline pulsatile mass, no hepatomegaly, no splenomegaly EXT:  2 plus pulses throughout, no edema, no cyanosis no clubbing SKIN:  No rashes no nodules NEURO:  Cranial nerves II through XII grossly intact, motor grossly intact throughout PSYCH:  Cognitively intact, oriented to person place and time    EKG:  EKG is ordered today. The ekg ordered today demonstrates sinus rhythm, rate 74, axis within normal limits, intervals within normal limits, no acute ST-T wave changes.   Recent Labs: 07/27/2019: B Natriuretic Peptide 54.1 08/07/2019: ALT 19 01/28/2020: BUN 17; Creatinine, Ser 0.65; Hemoglobin 14.1; Platelets 404; Potassium 4.0; Sodium 138    Lipid Panel    Component Value Date/Time   CHOL 211 (H) 06/06/2014 1200   TRIG 117 06/06/2014 1200   HDL 112 06/06/2014 1200   CHOLHDL 1.9 06/06/2014 1200   VLDL 23 06/06/2014 1200   LDLCALC 76 06/06/2014 1200      Wt Readings from Last 3 Encounters:  01/31/20 137 lb 12.8 oz (62.5 kg)   01/28/20 149 lb 11.1 oz (67.9 kg)  09/07/19 132 lb 12.8 oz (60.2 kg)      Other studies Reviewed: Additional studies/ records that were reviewed today include: Labs, ED records, hospital records. Review of the above records demonstrates:  Please see elsewhere in the note.     ASSESSMENT AND PLAN:  LEFT ARM PAIN:    The patient's arm pain was vague.  She had negative enzymes and a normal EKG in the emergency room.  She had a  negative MRI.  She has some vague atypical chest discomfort but no significant cardiovascular risk factors.  I looked back at her CT that she had in the emergency room and there was no coronary calcification.  I think the pretest probability of obstructive coronary disease is very low and I would not suggest further testing for this would be fruitful at this point.  I would evaluate this and her dyspnea as below.  DYSPNEA: Patient does have some vague shortness of breath.  There is no evidence of heart failure.  I would not strongly suspect structural heart disease.  I agree with pulmonary function testing.  If she has this evaluation and there is no clear etiology I would be happy to see her back to consider further testing.  She and I had this discussion.  PALPITATIONS: The patient does have some vague symptoms and had an episode of SVT noted when she was hospitalized previously.  I have given her a prescription for propranolol 10 mg as needed palpitations.  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  COVID EDUCATION: She has been vaccinated.   Current medicines are reviewed at length with the patient today.  The patient does not have concerns regarding medicines.  The following changes have been made:  no change  Labs/ tests ordered today include: None  Orders Placed This Encounter  Procedures  . EKG 12-Lead     Disposition:   FU with with me in 6  weeks.      Signed, Minus Breeding, MD  01/31/2020 6:10 PM    [facility]

## 2020-01-31 ENCOUNTER — Ambulatory Visit (INDEPENDENT_AMBULATORY_CARE_PROVIDER_SITE_OTHER): Payer: 59 | Admitting: Cardiology

## 2020-01-31 ENCOUNTER — Other Ambulatory Visit: Payer: Self-pay

## 2020-01-31 ENCOUNTER — Encounter: Payer: Self-pay | Admitting: Cardiology

## 2020-01-31 VITALS — BP 130/86 | HR 74 | Ht 63.5 in | Wt 137.8 lb

## 2020-01-31 DIAGNOSIS — M79602 Pain in left arm: Secondary | ICD-10-CM

## 2020-01-31 DIAGNOSIS — R002 Palpitations: Secondary | ICD-10-CM | POA: Insufficient documentation

## 2020-01-31 DIAGNOSIS — R0602 Shortness of breath: Secondary | ICD-10-CM

## 2020-01-31 DIAGNOSIS — Z7189 Other specified counseling: Secondary | ICD-10-CM | POA: Diagnosis not present

## 2020-01-31 MED ORDER — PROPRANOLOL HCL 10 MG PO TABS: 10.0000 mg | ORAL_TABLET | Freq: Three times a day (TID) | ORAL | 1 refills | Status: DC | PRN

## 2020-01-31 NOTE — Patient Instructions (Signed)
Medication Instructions:  Start Propranolol 10 mg every 8 hours as needed.  *If you need a refill on your cardiac medications before your next appointment, please call your pharmacy*   Follow-Up: At [facility], you and your health needs are our priority.  As part of our continuing mission to provide you with exceptional heart care, we have created designated Provider Care Teams.  These Care Teams include your primary Cardiologist (physician) and Advanced Practice Providers (APPs -  Physician Assistants and Nurse Practitioners) who all work together to provide you with the care you need, when you need it.  We recommend signing up for the patient portal called "MyChart".  Sign up information is provided on this After Visit Summary.  MyChart is used to connect with patients for Virtual Visits (Telemedicine).  Patients are able to view lab/test results, encounter notes, upcoming appointments, etc.  Non-urgent messages can be sent to your provider as well.   To learn more about what you can do with MyChart, go to [email or web address].    Your next appointment:   6 week(s)  The format for your next appointment:   In Person  Provider:   Minus Breeding, MD

## 2020-02-01 ENCOUNTER — Ambulatory Visit (INDEPENDENT_AMBULATORY_CARE_PROVIDER_SITE_OTHER): Payer: 59 | Admitting: Neurology

## 2020-02-01 ENCOUNTER — Encounter: Payer: Self-pay | Admitting: Neurology

## 2020-02-01 ENCOUNTER — Telehealth: Payer: Self-pay | Admitting: Neurology

## 2020-02-01 VITALS — BP 125/85 | HR 85 | Ht 63.0 in | Wt 137.0 lb

## 2020-02-01 DIAGNOSIS — R930 Abnormal findings on diagnostic imaging of skull and head, not elsewhere classified: Secondary | ICD-10-CM

## 2020-02-01 DIAGNOSIS — R200 Anesthesia of skin: Secondary | ICD-10-CM | POA: Diagnosis not present

## 2020-02-01 MED ORDER — ASPIRIN EC 81 MG PO TBEC: 81.0000 mg | DELAYED_RELEASE_TABLET | Freq: Every day | ORAL | 11 refills | Status: DC

## 2020-02-01 NOTE — Telephone Encounter (Signed)
I tried to call Stacey Calhoun at [facility] heart vascular but it went to voice mail.

## 2020-02-01 NOTE — Progress Notes (Signed)
[facility] [address] [place]. [place] [address] [phone]       OFFICE CONSULT NOTE  [name] Date of Birth:  11-23-1955 Medical Record Number:  [id number]   Referring MD:  Candice Camp PA-c Reason for Referral: Left-sided numbness  HPI: [name] is a 64 year old Caucasian lady seen today for initial office consultation visit.  History is obtained from the patient and review of electronic medical records and I personally reviewed available imaging films in PACS.  She has a past medical history of migraines, irritable bowel syndrome and diverticulitis.  She states on 01/27/2020 she developed sudden onset of numbness involving left arm from elbow down and leg from the knee down.  This has persisted since then without any improvement or worsening.  She describes this as a lack of feeling rather than tingling or any discomfort.  She had a mild headache for couple of days but not as bad as previous migraines and that has resolved.  She did not seek help the same day but since she did not get better the next day she saw her primary care physician who ordered an outpatient MRI scan of the brain which was done on 01/28/2020 which I personally reviewed shows several scattered periventricular and subcortical white matter hyperintensities on T2/FLAIR images which did not show any postcontrast enhancement.  MRI scan of the cervical spine was also obtained which showed only minor disc degenerative changes.  Patient was seen in the ER and had lab work including CBC, basic metabolic panel, troponin which were normal.  EKG was also negative.  Patient denies any prior history of strokes TIAs seizures loss of consciousness.  She does have factor V Leiden heterozygote state but denies history of DVT or pulmonary embolism.  She did have 1 miscarriage at 10 to 12 weeks.  She denies any accompanying symptoms of memory loss, imbalance, gait or balance issues.  She does admit to  being under significant stress and anxiety as she is caring for her husband who has ALS.  She has been on fluoxetine for years for her anxiety and recently the dose was changed to Wellbutrin which did not help as well and she is now back on low-dose fluoxetine.  She denies any tick bites, skin rashes, arthralgias, myalgias.  She does admit to getting fatigued and tired easily but denies any episodes of loss of vision, blurred vision, vertigo or diplopia.  She denies any bladder urgency or incontinence.  She does not have typical vascular risk factors in the form of hypertension, diabetes, hyperlipidemia, smoking or obesity  ROS:   14 system review of systems is positive for numbness, anxiety, stress, headache and all other systems negative  PMH:  Past Medical History:  Diagnosis Date  . Diverticulitis   . Fibroid   . IBS (irritable bowel syndrome)   . Interstitial cystitis   . Menorrhagia   . Migraines   . Osteoporosis    on reclast  . Thyroid nodule    u/s 2011 neg    Social History:  Social History   Socioeconomic History  . Marital status: Married    Spouse name: Not on file  . Number of children: Not on file  . Years of education: Not on file  . Highest education level: Not on file  Occupational History  . Not on file  Tobacco Use  . Smoking status: Never Smoker  . Smokeless tobacco: Never Used  Vaping Use  . Vaping Use: Never  used  Substance and Sexual Activity  . Alcohol use: No  . Drug use: No  . Sexual activity: Yes    Partners: Male    Birth control/protection: Surgical    Comment: hysterectomy  Other Topics Concern  . Not on file  Social History Narrative  . Not on file   Social Determinants of Health   Financial Resource Strain:   . Difficulty of Paying Living Expenses:   Food Insecurity:   . Worried About Charity fundraiser in the Last Year:   . Arboriculturist in the Last Year:   Transportation Needs:   . Film/video editor (Medical):   Marland Kitchen  Lack of Transportation (Non-Medical):   Physical Activity:   . Days of Exercise per Week:   . Minutes of Exercise per Session:   Stress:   . Feeling of Stress :   Social Connections:   . Frequency of Communication with Friends and Family:   . Frequency of Social Gatherings with Friends and Family:   . Attends Religious Services:   . Active Member of Clubs or Organizations:   . Attends Archivist Meetings:   Marland Kitchen Marital Status:   Intimate Partner Violence:   . Fear of Current or Ex-Partner:   . Emotionally Abused:   Marland Kitchen Physically Abused:   . Sexually Abused:     Medications:   Current Outpatient Medications on File Prior to Visit  Medication Sig Dispense Refill  . cetirizine (ZYRTEC) 10 MG tablet Take 10 mg by mouth as needed for allergies.    . Dextran 70-Hypromellose (ARTIFICIAL TEARS PF OP) Place 1 drop into both eyes daily as needed (For dry eyes).     Marland Kitchen FLUoxetine (PROZAC) 10 MG tablet Take 10 mg by mouth daily.    . fluticasone (FLONASE) 50 MCG/ACT nasal spray Place 1 spray into both nostrils daily as needed for allergies or rhinitis.    . Melatonin 5 MG TABS Take 1 tablet by mouth at bedtime.    Marland Kitchen omeprazole (PRILOSEC) 40 MG capsule Take 40 mg by mouth daily.    . pantoprazole (PROTONIX) 40 MG tablet Take 40 mg by mouth every morning.    . Probiotic Product (PROBIOTIC PO) Take by mouth daily.    . propranolol (INDERAL) 10 MG tablet Take 1 tablet (10 mg total) by mouth every 8 (eight) hours as needed. 90 tablet 1  . SUMAtriptan (IMITREX) 50 MG tablet Take 1 tablet (50 mg total) by mouth once. May repeat in 2 hours if headache persists or recurs. (Patient taking differently: Take 50 mg by mouth every 2 (two) hours as needed. May repeat in 2 hours if headache persists or recurs.) 18 tablet 2  . VITAMIN D, CHOLECALCIFEROL, PO Take by mouth.     No current facility-administered medications on file prior to visit.    Allergies:   Allergies  Allergen Reactions  .  Singulair [Montelukast] Other (See Comments)    Headaches, irritability   . Sulfa Antibiotics Rash    Physical Exam General: well developed, well nourished pleasant middle-age Caucasian lady, seated, in no evident distress Head: head normocephalic and atraumatic.   Neck: supple with no carotid or supraclavicular bruits Cardiovascular: regular rate and rhythm, no murmurs Musculoskeletal: no deformity Skin:  no rash/petichiae Vascular:  Normal pulses all extremities  Neurologic Exam Mental Status: Awake and fully alert. Oriented to place and time. Recent and remote memory intact. Attention span, concentration and fund of knowledge appropriate. Mood and  affect appropriate.  Cranial Nerves: Fundoscopic exam reveals sharp disc margins. Pupils equal, briskly reactive to light. Extraocular movements full without nystagmus. Visual fields full to confrontation. Hearing intact. Facial sensation intact. Face, tongue, palate moves normally and symmetrically.  Motor: Normal bulk and tone. Normal strength in all tested extremity muscles. Sensory.:  Subjectively diminished touch , pinprick sensation on the left lower face, upper and lower extremity and diminished, position and vibratory sensation in left upper and lower extremity..  Coordination: Rapid alternating movements normal in all extremities. Finger-to-nose and heel-to-shin performed accurately bilaterally. Gait and Station: Arises from chair without difficulty. Stance is normal. Gait demonstrates normal stride length and balance . Able to heel, toe and tandem walk without difficulty.  Reflexes: 2+ and symmetric. Toes downgoing.   NIHSS  1 Modified Rankin  2   ASSESSMENT: 64 year old Caucasian lady with episode of sudden onset of left arm and leg numbness of unclear etiology with an abnormal MRI scan showing nonspecific white matter hyperintensities possibly from small vessel disease..  Small right subcortical infarct not visualized on MRI is  still a possibility though patient lacks typical vascular risk factors.  She also has underlying significant anxiety.     PLAN: I had a long discussion the patient with regards to her episode of sudden onset of left-sided numbness which is of unclear etiology with negative brain imaging but could be a small stroke not visualized on MRI but she does not have traditional vascular risk factors.  Recommend she start taking aspirin 81 mg daily for stroke prevention and we will check hemoglobin A1c, lipid profile, 2D echo.  Check TCD bubble study for PFO and lower extremity venous Dopplers for DVT given the fact that she has factor V Leiden heterozygote state.  Check ANA panel, ESR, Lyme titer, HIV, RPR as well.  Return for follow-up in the future in 2 months or call earlier if necessary.  I also advised her to follow-up with the primary physician Dr. Joylene Draft need for optimal treatment for her her underlying anxiety which may also be contributing.  Greater than 50% time during this 45-minute consultation visit was spent on counseling and coordination of care about her numbness and abnormal MRI and answering questions. Antony Contras, MD  [facility] [address] [place], [place] [address]  Phone [phone] Fax [phone] Note: This document was prepared with digital dictation and possible smart phrase technology. Any transcriptional errors that result from this process are unintentional.

## 2020-02-01 NOTE — Patient Instructions (Signed)
I had a long discussion the patient with regards to her episode of sudden onset of left-sided numbness which is of unclear etiology with negative brain imaging but could be a small stroke not visualized on MRI but she does not have traditional vascular risk factors.  Recommend she start taking aspirin 81 mg daily for stroke prevention and we will check hemoglobin A1c, lipid profile, 2D echo.  Check TCD bubble study for PFO and lower extremity venous Dopplers for DVT given the fact that she has factor V Leiden heterozygote state.  Check ANA panel, ESR, Lyme titer, HIV, RPR as well.  Return for follow-up in the future in 2 months or call earlier if necessary.  I also advised her to follow-up with the primary physician Dr. Joylene Draft need for optimal treatment for her her underlying anxiety which may also be contributing.

## 2020-02-01 NOTE — Telephone Encounter (Signed)
Pearl @ [facility] has called re: test ordered by Dr Leonie Man.  She said that the Code used for 1 of the test is re: numbness.  Peal is looking for clarification on the needed test.

## 2020-02-02 ENCOUNTER — Other Ambulatory Visit (HOSPITAL_COMMUNITY): Payer: Self-pay | Admitting: Neurology

## 2020-02-02 NOTE — Telephone Encounter (Signed)
Called [facility] back and spoke with [place]. She needed more clarification on dx other than numbness for the VAS US/doppler. She had reviewed pt chart. Wondering if dx should be  factor V Leiden? Advised I will send a message to Dr. Leonie Man and call her back with clarification.

## 2020-02-03 ENCOUNTER — Other Ambulatory Visit: Payer: Self-pay | Admitting: Neurology

## 2020-02-03 DIAGNOSIS — D6851 Activated protein C resistance: Secondary | ICD-10-CM

## 2020-02-03 LAB — RPR: RPR Ser Ql: NONREACTIVE

## 2020-02-03 LAB — LIPID PANEL
Chol/HDL Ratio: 3 ratio (ref 0.0–4.4)
Cholesterol, Total: 243 mg/dL — ABNORMAL HIGH (ref 100–199)
HDL: 82 mg/dL (ref >39)
LDL Chol Calc (NIH): 136 mg/dL — ABNORMAL HIGH (ref 0–99)
Triglycerides: 142 mg/dL (ref 0–149)
VLDL Cholesterol Cal: 25 mg/dL (ref 5–40)

## 2020-02-03 LAB — SEDIMENTATION RATE: Sed Rate: 2 mm/hr (ref 0–40)

## 2020-02-03 LAB — HEMOGLOBIN A1C
Est. average glucose Bld gHb Est-mCnc: 114 mg/dL
Hgb A1c MFr Bld: 5.6 % (ref 4.8–5.6)

## 2020-02-03 LAB — ANA COMPREHENSIVE PANEL
Anti JO-1: <0.2 AI (ref 0.0–0.9)
Centromere Ab Screen: <0.2 AI (ref 0.0–0.9)
Chromatin Ab SerPl-aCnc: <0.2 AI (ref 0.0–0.9)
ENA RNP Ab: <0.2 AI (ref 0.0–0.9)
ENA SM Ab Ser-aCnc: <0.2 AI (ref 0.0–0.9)
ENA SSA (RO) Ab: <0.2 AI (ref 0.0–0.9)
ENA SSB (LA) Ab: <0.2 AI (ref 0.0–0.9)
Scleroderma (Scl-70) (ENA) Antibody, IgG: <0.2 AI (ref 0.0–0.9)
dsDNA Ab: <1 IU/mL (ref 0–9)

## 2020-02-03 LAB — B. BURGDORFI ANTIBODIES: Lyme IgG/IgM Ab: <0.91 {ISR} (ref 0.00–0.90)

## 2020-02-03 LAB — HIV ANTIBODY (ROUTINE TESTING W REFLEX): HIV Screen 4th Generation wRfx: NONREACTIVE

## 2020-02-03 NOTE — Telephone Encounter (Signed)
Called Pearl back. Relayed Dr. Clydene Fake message. She verbalized understanding. She states pt is tentatively scheduled for 02/14/20. Advised Dr. Leonie Man working in the office that week. She will have Velva Harman (Aeronautical engineer) reach out to Dr. Leonie Man to coordinate scheduling. Nothing further needed.

## 2020-02-03 NOTE — Telephone Encounter (Signed)
Ask her to use DVT in patient  or factor V leiden heterozygote state

## 2020-02-06 NOTE — Progress Notes (Signed)
Kindly inform the patient that most of the lab work is normal except cholesterol is elevated and to see primary care physician for recommendations for treatment for the same

## 2020-02-07 ENCOUNTER — Telehealth: Payer: Self-pay | Admitting: *Deleted

## 2020-02-07 NOTE — Telephone Encounter (Signed)
-----   Message from Garvin Fila, MD sent at 02/06/2020  4:35 PM EDT ----- Mitchell Heir inform the patient that most of the lab work is normal except cholesterol is elevated and to see primary care physician for recommendations for treatment for the same

## 2020-02-07 NOTE — Telephone Encounter (Signed)
I spoke to the patient and provided her with the lab results. States her PCP is aware of her elevated cholesterol and is monitoring it.

## 2020-02-08 ENCOUNTER — Other Ambulatory Visit: Payer: Self-pay

## 2020-02-08 ENCOUNTER — Ambulatory Visit (INDEPENDENT_AMBULATORY_CARE_PROVIDER_SITE_OTHER): Payer: 59 | Admitting: Internal Medicine

## 2020-02-08 DIAGNOSIS — R0602 Shortness of breath: Secondary | ICD-10-CM | POA: Diagnosis not present

## 2020-02-08 LAB — PULMONARY FUNCTION TEST
DL/VA % pred: 95 %
DL/VA: 4.03 ml/min/mmHg/L
DLCO cor % pred: 91 %
DLCO cor: 18.2 ml/min/mmHg
DLCO unc % pred: 93 %
DLCO unc: 18.58 ml/min/mmHg
FEF 25-75 Post: 3.23 L/sec
FEF 25-75 Pre: 2.71 L/sec
FEF2575-%Change-Post: 19 %
FEF2575-%Pred-Post: 141 %
FEF2575-%Pred-Pre: 119 %
FEV1-%Change-Post: 3 %
FEV1-%Pred-Post: 113 %
FEV1-%Pred-Pre: 109 %
FEV1-Post: 2.82 L
FEV1-Pre: 2.73 L
FEV1FVC-%Change-Post: 2 %
FEV1FVC-%Pred-Pre: 102 %
FEV6-%Change-Post: 0 %
FEV6-%Pred-Post: 109 %
FEV6-%Pred-Pre: 109 %
FEV6-Post: 3.39 L
FEV6-Pre: 3.41 L
FEV6FVC-%Change-Post: 0 %
FEV6FVC-%Pred-Post: 103 %
FEV6FVC-%Pred-Pre: 103 %
FVC-%Change-Post: 0 %
FVC-%Pred-Post: 106 %
FVC-%Pred-Pre: 105 %
FVC-Post: 3.43 L
FVC-Pre: 3.41 L
Post FEV1/FVC ratio: 82 %
Post FEV6/FVC ratio: 100 %
Pre FEV1/FVC ratio: 80 %
Pre FEV6/FVC Ratio: 100 %
RV % pred: 97 %
RV: 1.93 L
TLC % pred: 111 %
TLC: 5.56 L

## 2020-02-08 NOTE — Progress Notes (Signed)
PFT done today. 

## 2020-02-11 ENCOUNTER — Ambulatory Visit (INDEPENDENT_AMBULATORY_CARE_PROVIDER_SITE_OTHER): Payer: 59 | Admitting: Adult Health

## 2020-02-11 ENCOUNTER — Encounter: Payer: Self-pay | Admitting: Adult Health

## 2020-02-11 ENCOUNTER — Other Ambulatory Visit: Payer: Self-pay

## 2020-02-11 DIAGNOSIS — J90 Pleural effusion, not elsewhere classified: Secondary | ICD-10-CM | POA: Diagnosis not present

## 2020-02-11 DIAGNOSIS — J3089 Other allergic rhinitis: Secondary | ICD-10-CM | POA: Diagnosis not present

## 2020-02-11 DIAGNOSIS — R0602 Shortness of breath: Secondary | ICD-10-CM | POA: Diagnosis not present

## 2020-02-11 MED ORDER — ALBUTEROL SULFATE HFA 108 (90 BASE) MCG/ACT IN AERS: 1.0000 | INHALATION_SPRAY | Freq: Four times a day (QID) | RESPIRATORY_TRACT | 1 refills | Status: DC | PRN

## 2020-02-11 MED ORDER — BENZONATATE 200 MG PO CAPS
200.0000 mg | ORAL_CAPSULE | Freq: Three times a day (TID) | ORAL | 3 refills | Status: DC | PRN
Start: 2020-02-11 — End: 2020-04-12

## 2020-02-11 NOTE — Progress Notes (Signed)
@Patient  ID: Stacey Calhoun, female    DOB: 04-May-1956, 64 y.o.   MRN: [id number]  Chief Complaint  Patient presents with  . Follow-up    dyspnea     Referring provider: Crist Infante, MD  HPI: 64 year old female never smoker seen for initial pulmonary consult for a large exudative right-sided pleural effusion in the setting of a perforated appendicitis requiring washout in December 2020 at [facility].  She underwent thoracentesis August 07, 2019, she did have ongoing pleuritic pain that gradually improved.  TEST/EVENTS :  Pleural fluid January/2021 cytology negative with abundant inflammatory cells.  Cultures were negative.  02/11/2020 Follow up : Dyspnea  Patient returns for a 2-week follow-up.  Patient was seen last visit with persistent cough and shortness of breath after recent bronchitis.  Patient was seen earlier this year after she had a perforated appendicitis in December 2020.  She had a large exudative right pleural effusion that required thoracentesis.  Cultures and cytology were negative.  Patient had gradual clinical improvement.  She says she fully recovered.  However developed a bronchitis in June.  Had ongoing cough and shortness of breath was treated with antibiotics and steroids.  CT chest January 19, 2020.  Showed linear atelectasis at the right base.  Scattered areas of scarring with atelectasis bilaterally.  No acute infiltrate or pneumothorax.  No pleural effusion.  Negative for PE. Since last visit patient says she is slowly getting better.  Her cough and shortness of breath have improved.  She continues to have an ongoing lingering dry cough that is minimally productive.  Feels that she has something stuck in her throat that she has to constantly clear her throat.  Patient had pulmonary function testing done today that showed normal lung function with no airflow obstruction or restriction.  FEV1 113%, ratio 82, FVC 106% DLCO 93%.,  No significant bronchodilator  response. Cough seems to be worse when she is eating.  She is on reflux medicines but has not shown any difference.  Allergies  Allergen Reactions  . Singulair [Montelukast] Other (See Comments)    Headaches, irritability   . Sulfa Antibiotics Rash    Immunization History  Administered Date(s) Administered  . Influenza, High Dose Seasonal PF 05/06/2019  . Influenza,inj,Quad PF,6+ Mos 05/02/2018  . PFIZER SARS-COV-2 Vaccination 08/28/2019, 09/22/2019  . Zoster Recombinat (Shingrix) 07/18/2017    Past Medical History:  Diagnosis Date  . Diverticulitis   . Fibroid   . IBS (irritable bowel syndrome)   . Interstitial cystitis   . Menorrhagia   . Migraines   . Osteoporosis    on reclast  . Thyroid nodule    u/s 2011 neg    Tobacco History: Social History   Tobacco Use  Smoking Status Never Smoker  Smokeless Tobacco Never Used   Counseling given: Not Answered   Outpatient Medications Prior to Visit  Medication Sig Dispense Refill  . aspirin EC 81 MG tablet Take 1 tablet (81 mg total) by mouth daily. Swallow whole. 30 tablet 11  . cetirizine (ZYRTEC) 10 MG tablet Take 10 mg by mouth as needed for allergies.    . Dextran 70-Hypromellose (ARTIFICIAL TEARS PF OP) Place 1 drop into both eyes daily as needed (For dry eyes).     Marland Kitchen FLUoxetine (PROZAC) 10 MG tablet Take 10 mg by mouth daily.    . fluticasone (FLONASE) 50 MCG/ACT nasal spray Place 1 spray into both nostrils daily as needed for allergies or rhinitis.    Marland Kitchen  Melatonin 5 MG TABS Take 1 tablet by mouth at bedtime.    Marland Kitchen omeprazole (PRILOSEC) 40 MG capsule Take 40 mg by mouth daily.    . pantoprazole (PROTONIX) 40 MG tablet Take 40 mg by mouth every morning.    . Probiotic Product (PROBIOTIC PO) Take by mouth daily.    . propranolol (INDERAL) 10 MG tablet Take 1 tablet (10 mg total) by mouth every 8 (eight) hours as needed. 90 tablet 1  . SUMAtriptan (IMITREX) 50 MG tablet Take 1 tablet (50 mg total) by mouth once. May  repeat in 2 hours if headache persists or recurs. (Patient taking differently: Take 50 mg by mouth every 2 (two) hours as needed. May repeat in 2 hours if headache persists or recurs.) 18 tablet 2  . VITAMIN D, CHOLECALCIFEROL, PO Take by mouth.     No facility-administered medications prior to visit.     Review of Systems:   Constitutional:   No  weight loss, night sweats,  Fevers, chills, fatigue, or  lassitude.  HEENT:   No headaches,  Difficulty swallowing,  Tooth/dental problems, or  Sore throat,                No sneezing, itching, ear ache, nasal congestion, post nasal drip,   CV:  No chest pain,  Orthopnea, PND, swelling in lower extremities, anasarca, dizziness, palpitations, syncope.   GI  No heartburn, indigestion, abdominal pain, nausea, vomiting, diarrhea, change in bowel habits, loss of appetite, bloody stools.   Resp: .  No chest wall deformity  Skin: no rash or lesions.  GU: no dysuria, change in color of urine, no urgency or frequency.  No flank pain, no hematuria   MS:  No joint pain or swelling.  No decreased range of motion.  No back pain.    Physical Exam  BP 116/68 (BP Location: Left Arm, Cuff Size: Normal)   Pulse 82   Temp 98 F (36.7 C) (Oral)   Ht 5\' 3"  (1.6 m)   Wt 141 lb 4.8 oz (64.1 kg)   LMP 07/23/2003   SpO2 98%   BMI 25.03 kg/m   GEN: A/Ox3; pleasant , NAD, well nourished    HEENT:  [place]/AT,   NOSE-clear, THROAT-clear, no lesions, no postnasal drip or exudate noted.   NECK:  Supple w/ fair ROM; no JVD; normal carotid impulses w/o bruits; no thyromegaly or nodules palpated; no lymphadenopathy.    RESP  Clear  P & A; w/o, wheezes/ rales/ or rhonchi. no accessory muscle use, no dullness to percussion  CARD:  RRR, no m/r/g, no peripheral edema, pulses intact, no cyanosis or clubbing.  GI:   Soft & nt; nml bowel sounds; no organomegaly or masses detected.   Musco: Warm bil, no deformities or joint swelling noted.   Neuro: alert, no focal  deficits noted.    Skin: Warm, no lesions or rashes    Lab Results:  BNP    Component Value Date/Time   BNP 54.1 07/27/2019 0910    ProBNP No results found for: PROBNP  Imaging: DG Chest 2 View  Result Date: 01/28/2020 CLINICAL DATA:  Dyspnea, chest pain EXAM: CHEST - 2 VIEW COMPARISON:  None. FINDINGS: The heart size and mediastinal contours are within normal limits. Both lungs are clear. The visualized skeletal structures are unremarkable. IMPRESSION: No active cardiopulmonary disease. Electronically Signed   By: Fidela Salisbury MD   On: 01/28/2020 10:28   CT Head Wo Contrast  Result Date: 01/28/2020  CLINICAL DATA:  Left arm and leg numbness x2 days EXAM: CT HEAD WITHOUT CONTRAST TECHNIQUE: Contiguous axial images were obtained from the base of the skull through the vertex without intravenous contrast. COMPARISON:  None. FINDINGS: Brain: Normal anatomic configuration. There is moderate scattered white matter changes within the periventricular white matter and centrum semiovale most in keeping with microangiopathic change. No abnormal intra or extra-axial mass lesion or fluid collection. No abnormal mass effect or midline shift. No evidence of acute intracranial hemorrhage or infarct. Ventricular size is normal. Cerebellum unremarkable. Vascular: Unremarkable Skull: Intact Sinuses/Orbits: Paranasal sinuses are clear. Orbits are unremarkable. Other: Mastoid air cells and middle ear cavities are clear. IMPRESSION: Senescent changes. No evidence of acute intracranial hemorrhage or infarct. Electronically Signed   By: Fidela Salisbury MD   On: 01/28/2020 10:34   CT ANGIO CHEST PE W OR WO CONTRAST  Result Date: 01/19/2020 CLINICAL DATA:  Pleuritic chest pain. History of skin cancer. History of factor 5 clotting disorder. EXAM: CT ANGIOGRAPHY CHEST WITH CONTRAST TECHNIQUE: Multidetector CT imaging of the chest was performed using the standard protocol during bolus administration of intravenous  contrast. Multiplanar CT image reconstructions and MIPs were obtained to evaluate the vascular anatomy. CONTRAST:  39mL ISOVUE-370 IOPAMIDOL (ISOVUE-370) INJECTION 76% COMPARISON:  CT chest dated August 07, 2019. FINDINGS: Cardiovascular: Contrast injection is sufficient to demonstrate satisfactory opacification of the pulmonary arteries to the segmental level. There is no pulmonary embolus or evidence of right heart strain. The size of the main pulmonary artery is normal. Heart size is normal, with no pericardial effusion. The course and caliber of the aorta are normal. There is no atherosclerotic calcification. Opacification decreased due to pulmonary arterial phase contrast bolus timing. Mediastinum/Nodes: -- No mediastinal lymphadenopathy. -- No hilar lymphadenopathy. -- No axillary lymphadenopathy. -- No supraclavicular lymphadenopathy. -- Normal thyroid gland where visualized. -  Unremarkable esophagus. Lungs/Pleura: There is linear atelectasis at the right lung base. There are scattered areas of scarring is atelectasis bilaterally. There is no pneumothorax. No focal infiltrate. No significant pleural effusion. Upper Abdomen: Contrast bolus timing is not optimized for evaluation of the abdominal organs. The visualized portions of the organs of the upper abdomen are normal. Musculoskeletal: No chest wall abnormality. No bony spinal canal stenosis. There is a indeterminate 1 cm right-sided breast nodule (axial series 4, image 49). Additional smaller bilateral breast nodules are suspected. Review of the MIP images confirms the above findings. IMPRESSION: 1. No evidence of pulmonary embolism. 2. Scattered areas of scarring and atelectasis. 3. Indeterminate 1 cm right-sided breast nodule. Follow-up with outpatient mammography is recommended. Electronically Signed   By: Constance Holster M.D.   On: 01/19/2020 17:01   MR BRAIN WO CONTRAST  Result Date: 01/28/2020 CLINICAL DATA:  Focal neuro deficit, greater than  6 hours, stroke suspected. Additional history obtained from [facility] to left arm and left leg for 2 days. EXAM: MRI HEAD WITHOUT CONTRAST TECHNIQUE: Multiplanar, multiecho pulse sequences of the brain and surrounding structures were obtained without intravenous contrast. COMPARISON:  Head CT 01/28/2020 FINDINGS: Brain: Cerebral volume is normal for age. Moderate patchy T2/FLAIR hyperintensity within the cerebral white matter is advanced for age and nonspecific. There is no acute infarct. No evidence of intracranial mass. No chronic intracranial blood products. No extra-axial fluid collection. No midline shift. Vascular: Visualized orbits show no acute finding. Skull and upper cervical spine: No focal marrow lesion. Sinuses/Orbits: Visualized orbits show no acute finding. Minimal ethmoid sinus mucosal thickening. No significant  mastoid effusion. IMPRESSION: No evidence of acute intracranial abnormality, including acute infarction. Moderate patchy T2 hyperintense signal abnormality within the cerebral white matter is advanced for age and nonspecific. Differential considerations include chronic small vessel ischemic disease, sequelae of a prior infectious/inflammatory process, a demyelinating process such as multiple sclerosis and vasculitis, among others. Minimal ethmoid sinus mucosal thickening. Electronically Signed   By: Kellie Simmering DO   On: 01/28/2020 15:26   MR BRAIN W CONTRAST  Result Date: 01/28/2020 CLINICAL DATA:  Provided history: Multiple sclerosis, new event. Additional provided: Concern for MS. EXAM: MRI HEAD WITH CONTRAST TECHNIQUE: Multiplanar, multiecho pulse sequences of the brain and surrounding structures were obtained with intravenous contrast. CONTRAST:  6.90mL GADAVIST GADOBUTROL 1 MMOL/ML IV SOLN COMPARISON:  Noncontrast brain MRI performed earlier the same day 01/28/2020, head CT 01/28/2020 FINDINGS: Brain: A limited protocol pre and postcontrast brain MRI was  performed as a follow-up to the noncontrast brain MRI performed earlier the same day. The current study consists of a 3D T2/FLAIR sequence as well as axial, coronal and sagittal postcontrast T1 weighted imaging. Unchanged multifocal T2 hyperintensity within the subcortical/juxtacortical and deep periventricular white matter as compared to the noncontrast brain MRI performed earlier the same day. No abnormal intracranial enhancement is identified. Vascular: Expected enhancement within the proximal large arterial vessels and dural venous sinuses. Skull and upper cervical spine: No enhancing calvarial lesion is identified. Sinuses/Orbits: No evidence of acute orbital abnormality. Mild ethmoid sinus mucosal thickening. IMPRESSION: Redemonstrated moderate patchy T2/FLAIR hyperintense signal abnormality within the cerebral white matter which is advanced for age and nonspecific. Differential considerations include chronic small vessel ischemic disease, a demyelinating process such as multiple sclerosis, sequelae of a prior infectious/inflammatory process, vasculitis and migraine headaches, among others. No abnormal intracranial enhancement is demonstrated. Mild ethmoid sinus mucosal thickening. Electronically Signed   By: Kellie Simmering DO   On: 01/28/2020 17:35   MR Cervical Spine W or Wo Contrast  Result Date: 01/28/2020 CLINICAL DATA:  Concern for MS; demyelinating disease. EXAM: MRI CERVICAL SPINE WITHOUT AND WITH CONTRAST TECHNIQUE: Multiplanar and multiecho pulse sequences of the cervical spine, to include the craniocervical junction and cervicothoracic junction, were obtained without and with intravenous contrast. CONTRAST:  6.73mL GADAVIST GADOBUTROL 1 MMOL/ML IV SOLN COMPARISON:  Same-day brain MRI 01/28/2020. FINDINGS: The sagittal postcontrast T1 weighted sequence is moderately motion degraded, which limits evaluation for abnormal cord enhancement. The axial postcontrast T1 weighted imaging is of good quality.  Alignment: Straightening of the expected cervical lordosis. No significant spondylolisthesis. Vertebrae: Vertebral body height is maintained no significant marrow edema or suspicious osseous lesion. Cord: No spinal cord signal abnormality or abnormal cord enhancement is identified. Posterior Fossa, vertebral arteries, paraspinal tissues: No abnormality identified within included portions of the posterior fossa. Flow voids preserved within the imaged cervical vertebral arteries. Paraspinal soft tissues within normal limits. Disc levels: Mild multilevel disc desiccation without significant disc height loss. C2-C3: No significant disc herniation or stenosis. C3-C4: Shallow disc bulge with superimposed tiny central disc protrusion. No significant canal or foraminal stenosis C4-C5: Minimal disc bulge. No significant spinal canal or foraminal stenosis. C5-C6: No significant disc herniation or stenosis. C6-C7: No significant disc herniation or stenosis. C7-T1: No significant disc herniation or stenosis. IMPRESSION: The sagittal T1 weighted post-contrast sequence is moderately motion degraded, somewhat limiting evaluation for abnormal spinal cord enhancement. However, the axial T1 weighted post-contrast imaging is of good quality. No spinal cord signal abnormality or abnormal cord enhancement is identified. Mild cervical spondylosis.  No significant spinal canal or foraminal narrowing at any level. Electronically Signed   By: Kellie Simmering DO   On: 01/28/2020 17:43      PFT Results Latest Ref Rng & Units 02/08/2020  FVC-Pre L 3.41  FVC-Predicted Pre % 105  FVC-Post L 3.43  FVC-Predicted Post % 106  Pre FEV1/FVC % % 80  Post FEV1/FCV % % 82  FEV1-Pre L 2.73  FEV1-Predicted Pre % 109  FEV1-Post L 2.82  DLCO UNC% % 93  DLCO COR %Predicted % 95  TLC L 5.56  TLC % Predicted % 111  RV % Predicted % 97    No results found for: NITRICOXIDE      Assessment & Plan:   SOB (shortness of breath) Cough and  dyspnea seems to be improving after recent bronchitis.  CT chest showed no evidence of PE or acute process.  Patient has some right basilar atelectasis most likely secondary to previous large pleural effusion in January.  No evidence of recurrent pleural effusion.  Pulmonary function testing shows normal lung function.  May have a component of reactive airways and upper cough syndrome from recent bronchitis.  We will place her on upper airway cough regimen with Delsym and Tessalon.  Add ProAir to use as needed.  Follow-up in a few months  Plan  Patient Instructions  Continue on Prilosec daily .  GERD diet  Sips of water to soothe throat and avoid coughing  Zyrtec 10mg  At bedtime   Begin Delsym 2 tsp Twice daily  For cough As needed  Begin Tessalon Three times a day for cough As needed   NO MINTS  Albuterol Inhaler 1-2 puffs every 6hr as needed .  Follow up with Dr. Shearon Stalls in 3 months and As needed  Please contact office for sooner follow up if symptoms do not improve or worsen or seek emergency care        Perennial allergic rhinitis with a probable nonallergic component Continue on current regimen continue on trigger prevention  Pleural effusion Status post thoracentesis in January 2021.  No evidence of recurrence on CT chest     Sadey Yandell, NP 02/11/2020

## 2020-02-11 NOTE — Patient Instructions (Addendum)
Continue on Prilosec daily .  GERD diet  Sips of water to soothe throat and avoid coughing  Zyrtec 10mg  At bedtime   Begin Delsym 2 tsp Twice daily  For cough As needed  Begin Tessalon Three times a day for cough As needed   NO MINTS  Albuterol Inhaler 1-2 puffs every 6hr as needed .  Follow up with Dr. Shearon Stalls in 3 months and As needed  Please contact office for sooner follow up if symptoms do not improve or worsen or seek emergency care

## 2020-02-11 NOTE — Assessment & Plan Note (Signed)
Continue on current regimen continue on trigger prevention

## 2020-02-11 NOTE — Assessment & Plan Note (Signed)
Cough and dyspnea seems to be improving after recent bronchitis.  CT chest showed no evidence of PE or acute process.  Patient has some right basilar atelectasis most likely secondary to previous large pleural effusion in January.  No evidence of recurrent pleural effusion.  Pulmonary function testing shows normal lung function.  May have a component of reactive airways and upper cough syndrome from recent bronchitis.  We will place her on upper airway cough regimen with Delsym and Tessalon.  Add ProAir to use as needed.  Follow-up in a few months  Plan  Patient Instructions  Continue on Prilosec daily .  GERD diet  Sips of water to soothe throat and avoid coughing  Zyrtec 10mg  At bedtime   Begin Delsym 2 tsp Twice daily  For cough As needed  Begin Tessalon Three times a day for cough As needed   NO MINTS  Albuterol Inhaler 1-2 puffs every 6hr as needed .  Follow up with Dr. Shearon Stalls in 3 months and As needed  Please contact office for sooner follow up if symptoms do not improve or worsen or seek emergency care

## 2020-02-11 NOTE — Assessment & Plan Note (Signed)
Status post thoracentesis in January 2021.  No evidence of recurrence on CT chest

## 2020-02-14 ENCOUNTER — Ambulatory Visit (HOSPITAL_BASED_OUTPATIENT_CLINIC_OR_DEPARTMENT_OTHER)
Admission: RE | Admit: 2020-02-14 | Discharge: 2020-02-14 | Disposition: A | Discharge status: Home / Self Care | Payer: 59 | Source: Ambulatory Visit | Attending: Neurology | Admitting: Neurology

## 2020-02-14 ENCOUNTER — Other Ambulatory Visit: Payer: Self-pay

## 2020-02-14 ENCOUNTER — Ambulatory Visit (HOSPITAL_COMMUNITY)
Admission: RE | Admit: 2020-02-14 | Discharge: 2020-02-14 | Disposition: A | Discharge status: Home / Self Care | Payer: 59 | Source: Ambulatory Visit | Attending: Neurology | Admitting: Neurology

## 2020-02-14 DIAGNOSIS — D6851 Activated protein C resistance: Secondary | ICD-10-CM | POA: Diagnosis present

## 2020-02-14 DIAGNOSIS — R200 Anesthesia of skin: Secondary | ICD-10-CM | POA: Diagnosis not present

## 2020-02-17 ENCOUNTER — Encounter: Payer: Self-pay | Admitting: *Deleted

## 2020-02-17 NOTE — Progress Notes (Signed)
Kindly inform the patient that lower extremity venous Doppler study did not show any evidence of blood clots

## 2020-02-24 ENCOUNTER — Ambulatory Visit: Payer: 59 | Admitting: Adult Health

## 2020-02-25 ENCOUNTER — Ambulatory Visit: Payer: 59 | Admitting: Cardiology

## 2020-04-12 ENCOUNTER — Other Ambulatory Visit: Payer: Self-pay

## 2020-04-12 ENCOUNTER — Encounter: Payer: Self-pay | Admitting: Neurology

## 2020-04-12 ENCOUNTER — Ambulatory Visit (INDEPENDENT_AMBULATORY_CARE_PROVIDER_SITE_OTHER): Payer: 59 | Admitting: Neurology

## 2020-04-12 VITALS — BP 112/70 | HR 72 | Ht 63.0 in | Wt 140.0 lb

## 2020-04-12 DIAGNOSIS — R200 Anesthesia of skin: Secondary | ICD-10-CM | POA: Diagnosis not present

## 2020-04-12 DIAGNOSIS — E7849 Other hyperlipidemia: Secondary | ICD-10-CM

## 2020-04-12 MED ORDER — ATORVASTATIN CALCIUM 10 MG PO TABS: 10.0000 mg | ORAL_TABLET | Freq: Every day | ORAL | 11 refills | Status: AC

## 2020-04-12 NOTE — Progress Notes (Signed)
[facility] [address] [place]. [place] [address] [phone]       OFFICE FOLLOW UP VISIT  [name] Date of Birth:  11-Mar-1956 Medical Record Number:  [id number]   Referring MD:  Candice Camp PA-c Reason for Referral: Left-sided numbness  HPI: Initial 7/30 Stacey Calhoun is a 64 year old Caucasian lady seen today for initial office consultation visit.  History is obtained from the patient and review of electronic medical records and I personally reviewed available imaging films in PACS.  She has a past medical history of migraines, irritable bowel syndrome and diverticulitis.  She states on 01/27/2020 she developed sudden onset of numbness involving left arm from elbow down and leg from the knee down.  This has persisted since then without any improvement or worsening.  She describes this as a lack of feeling rather than tingling or any discomfort.  She had a mild headache for couple of days but not as bad as previous migraines and that has resolved.  She did not seek help the same day but since she did not get better the next day she saw her primary care physician who ordered an outpatient MRI scan of the brain which was done on 01/28/2020 which I personally reviewed shows several scattered periventricular and subcortical white matter hyperintensities on T2/FLAIR images which did not show any postcontrast enhancement.  MRI scan of the cervical spine was also obtained which showed only minor disc degenerative changes.  Patient was seen in the ER and had lab work including CBC, basic metabolic panel, troponin which were normal.  EKG was also negative.  Patient denies any prior history of strokes TIAs seizures loss of consciousness.  She does have factor V Leiden heterozygote state but denies history of DVT or pulmonary embolism.  She did have 1 miscarriage at 10 to 12 weeks.  She denies any accompanying symptoms of memory loss, imbalance, gait or balance issues.   She does admit to being under significant stress and anxiety as she is caring for her husband who has ALS.  She has been on fluoxetine for years for her anxiety and recently the dose was changed to Wellbutrin which did not help as well and she is now back on low-dose fluoxetine.  She denies any tick bites, skin rashes, arthralgias, myalgias.  She does admit to getting fatigued and tired easily but denies any episodes of loss of vision, blurred vision, vertigo or diplopia.  She denies any bladder urgency or incontinence.  She does not have typical vascular risk factors in the form of hypertension, diabetes, hyperlipidemia, smoking or obesity Update 04/12/2020: She returns for follow-up after last visit 2 months ago.  Patient states she is done well any other stroke or TIA-like symptoms.  She has recently been started on blood pressure medicine by primary care physician blood pressure is doing today it is 112/70.  Had advised her to start taking cholesterol medicine to discuss his primary care physician first.  Lab work at last visit had shown hemoglobin A1c 5.6 and LDL cholesterol 136 mg percent.  Lyme titer was negative RPR was negative ESR and ANA panel was negative.  TCD bubble study showed no evidence of PFO.  Lower extremity venous Dopplers were negative.  Echo was ordered but for unclear reason has not been done.  Patient has a family history of cardiac disease and elevated lipids and is willing to try medication possible statin side effects.  She has no new complaints today ROS:  14 system review of systems is positive for numbness, anxiety, stress, only and all other systems negative  PMH:  Past Medical History:  Diagnosis Date  . Diverticulitis   . Fibroid   . IBS (irritable bowel syndrome)   . Interstitial cystitis   . Menorrhagia   . Migraines   . Osteoporosis    on reclast  . Thyroid nodule    u/s 2011 neg    Social History:  Social History   Socioeconomic History  . Marital  status: Married    Spouse name: Not on file  . Number of children: Not on file  . Years of education: Not on file  . Highest education level: Not on file  Occupational History  . Not on file  Tobacco Use  . Smoking status: Never Smoker  . Smokeless tobacco: Never Used  Vaping Use  . Vaping Use: Never used  Substance and Sexual Activity  . Alcohol use: No  . Drug use: No  . Sexual activity: Yes    Partners: Male    Birth control/protection: Surgical    Comment: hysterectomy  Other Topics Concern  . Not on file  Social History Narrative  . Not on file   Social Determinants of Health   Financial Resource Strain:   . Difficulty of Paying Living Expenses: Not on file  Food Insecurity:   . Worried About Charity fundraiser in the Last Year: Not on file  . Ran Out of Food in the Last Year: Not on file  Transportation Needs:   . Lack of Transportation (Medical): Not on file  . Lack of Transportation (Non-Medical): Not on file  Physical Activity:   . Days of Exercise per Week: Not on file  . Minutes of Exercise per Session: Not on file  Stress:   . Feeling of Stress : Not on file  Social Connections:   . Frequency of Communication with Friends and Family: Not on file  . Frequency of Social Gatherings with Friends and Family: Not on file  . Attends Religious Services: Not on file  . Active Member of Clubs or Organizations: Not on file  . Attends Archivist Meetings: Not on file  . Marital Status: Not on file  Intimate Partner Violence:   . Fear of Current or Ex-Partner: Not on file  . Emotionally Abused: Not on file  . Physically Abused: Not on file  . Sexually Abused: Not on file    Medications:   Current Outpatient Medications on File Prior to Visit  Medication Sig Dispense Refill  . aspirin EC 81 MG tablet Take 1 tablet (81 mg total) by mouth daily. Swallow whole. 30 tablet 11  . cetirizine (ZYRTEC) 10 MG tablet Take 10 mg by mouth as needed for allergies.     . Dextran 70-Hypromellose (ARTIFICIAL TEARS PF OP) Place 1 drop into both eyes daily as needed (For dry eyes).     Marland Kitchen FLUoxetine (PROZAC) 10 MG tablet Take 10 mg by mouth daily.    . fluticasone (FLONASE) 50 MCG/ACT nasal spray Place 1 spray into both nostrils daily as needed for allergies or rhinitis.    . Melatonin 5 MG TABS Take 1 tablet by mouth at bedtime.    . pantoprazole (PROTONIX) 40 MG tablet Take 40 mg by mouth every morning.    . Probiotic Product (PROBIOTIC PO) Take by mouth daily.    . SUMAtriptan (IMITREX) 50 MG tablet Take 1 tablet (50 mg total) by mouth once.  May repeat in 2 hours if headache persists or recurs. (Patient taking differently: Take 50 mg by mouth every 2 (two) hours as needed. May repeat in 2 hours if headache persists or recurs.) 18 tablet 2  . VITAMIN D, CHOLECALCIFEROL, PO Take by mouth.    . telmisartan (MICARDIS) 20 MG tablet Take 20 mg by mouth daily.     No current facility-administered medications on file prior to visit.    Allergies:   Allergies  Allergen Reactions  . Singulair [Montelukast] Other (See Comments)    Headaches, irritability   . Sulfa Antibiotics Rash    Physical Exam General: well developed, well nourished pleasant middle-age Caucasian lady, seated, in no evident distress.  She appears mildly anxious Head: head normocephalic and atraumatic.   Neck: supple with no carotid or supraclavicular bruits Cardiovascular: regular rate and rhythm, no murmurs Musculoskeletal: no deformity Skin:  no rash/petichiae Vascular:  Normal pulses all extremities  Neurologic Exam Mental Status: Awake and fully alert. Oriented to place and time. Recent and remote memory intact. Attention span, concentration and fund of knowledge appropriate. Mood and affect appropriate.  Cranial Nerves: Fundoscopic exam not done. Pupils equal, briskly reactive to light. Extraocular movements full without nystagmus. Visual fields full to confrontation. Hearing intact.  Facial sensation intact. Face, tongue, palate moves normally and symmetrically.  Motor: Normal bulk and tone. Normal strength in all tested extremity muscles. Sensory.:  Subjectively diminished touch , pinprick sensation on the left lower face, upper and lower extremity and diminished, position and vibratory sensation in left upper and lower extremity..  Coordination: Rapid alternating movements normal in all extremities. Finger-to-nose and heel-to-shin performed accurately bilaterally. Gait and Station: Arises from chair without difficulty. Stance is normal. Gait demonstrates normal stride length and balance . Able to heel, toe and tandem walk without difficulty.  Reflexes: 2+ and symmetric. Toes downgoing.   NIHSS  1 Modified Rankin  2   ASSESSMENT: 64 year old Caucasian lady with episode of sudden onset of left arm and leg numbness of unclear etiology with an abnormal MRI scan showing nonspecific white matter hyperintensities possibly from small vessel disease..  Small right subcortical infarct not visualized on MRI is still a possibility given risk factors of hypertension, mild hyperlipidemia and family history.  She also has underlying significant anxiety.     PLAN: I had a long discussion with the patient regarding her results of lab work for autoimmune and inflammatory and infectious conditions all of which is unremarkable.  She has mildly elevated LDL cholesterol and given her family history of cardiac disease and her abnormal brain MRI and recommend she start taking Lipitor 10 mg daily as well as go on a low-fat diet.  She will stay on aspirin for stroke prevention and return for follow-up in the future only as necessary.Greater than 50% time during this 25-minute  visit was spent on counseling and coordination of care about her numbness and abnormal MRI and answering questions. Antony Contras, MD  [facility] [address] [place], [place]  [address]  Phone [phone] Fax [phone] Note: This document was prepared with digital dictation and possible smart phrase technology. Any transcriptional errors that result from this process are unintentional.

## 2020-04-12 NOTE — Patient Instructions (Signed)
I had a long discussion with the patient regarding her results of lab work for autoimmune and inflammatory and infectious conditions all of which is unremarkable.  She has mildly elevated LDL cholesterol and given her family history of cardiac disease and her abnormal brain MRI and recommend she start taking Lipitor 10 mg daily as well as go on a low-fat diet.  She will stay on aspirin for stroke prevention and return for follow-up in the future only as necessary.   Stroke Prevention Some medical conditions and behaviors are associated with a higher chance of having a stroke. You can help prevent a stroke by making nutrition, lifestyle, and other changes, including managing any medical conditions you may have. What nutrition changes can be made?   Eat healthy foods. You can do this by: ? Choosing foods high in fiber, such as fresh fruits and vegetables and whole grains. ? Eating at least 5 or more servings of fruits and vegetables a day. Try to fill half of your plate at each meal with fruits and vegetables. ? Choosing lean protein foods, such as lean cuts of meat, poultry without skin, fish, tofu, beans, and nuts. ? Eating low-fat dairy products. ? Avoiding foods that are high in salt (sodium). This can help lower blood pressure. ? Avoiding foods that have saturated fat, trans fat, and cholesterol. This can help prevent high cholesterol. ? Avoiding processed and premade foods.  Follow your health care provider's specific guidelines for losing weight, controlling high blood pressure (hypertension), lowering high cholesterol, and managing diabetes. These may include: ? Reducing your daily calorie intake. ? Limiting your daily sodium intake to 1,500 milligrams (mg). ? Using only healthy fats for cooking, such as olive oil, canola oil, or sunflower oil. ? Counting your daily carbohydrate intake. What lifestyle changes can be made?  Maintain a healthy weight. Talk to your health care provider  about your ideal weight.  Get at least 30 minutes of moderate physical activity at least 5 days a week. Moderate activity includes brisk walking, biking, and swimming.  Do not use any products that contain nicotine or tobacco, such as cigarettes and e-cigarettes. If you need help quitting, ask your health care provider. It may also be helpful to avoid exposure to secondhand smoke.  Limit alcohol intake to no more than 1 drink a day for nonpregnant women and 2 drinks a day for men. One drink equals 12 oz of beer, 5 oz of wine, or 1 oz of hard liquor.  Stop any illegal drug use.  Avoid taking birth control pills. Talk to your health care provider about the risks of taking birth control pills if: ? You are over 83 years old. ? You smoke. ? You get migraines. ? You have ever had a blood clot. What other changes can be made?  Manage your cholesterol levels. ? Eating a healthy diet is important for preventing high cholesterol. If cholesterol cannot be managed through diet alone, you may also need to take medicines. ? Take any prescribed medicines to control your cholesterol as told by your health care provider.  Manage your diabetes. ? Eating a healthy diet and exercising regularly are important parts of managing your blood sugar. If your blood sugar cannot be managed through diet and exercise, you may need to take medicines. ? Take any prescribed medicines to control your diabetes as told by your health care provider.  Control your hypertension. ? To reduce your risk of stroke, try to keep your blood pressure  below 130/80. ? Eating a healthy diet and exercising regularly are an important part of controlling your blood pressure. If your blood pressure cannot be managed through diet and exercise, you may need to take medicines. ? Take any prescribed medicines to control hypertension as told by your health care provider. ? Ask your health care provider if you should monitor your blood pressure  at home. ? Have your blood pressure checked every year, even if your blood pressure is normal. Blood pressure increases with age and some medical conditions.  Get evaluated for sleep disorders (sleep apnea). Talk to your health care provider about getting a sleep evaluation if you snore a lot or have excessive sleepiness.  Take over-the-counter and prescription medicines only as told by your health care provider. Aspirin or blood thinners (antiplatelets or anticoagulants) may be recommended to reduce your risk of forming blood clots that can lead to stroke.  Make sure that any other medical conditions you have, such as atrial fibrillation or atherosclerosis, are managed. What are the warning signs of a stroke? The warning signs of a stroke can be easily remembered as BEFAST.  B is for balance. Signs include: ? Dizziness. ? Loss of balance or coordination. ? Sudden trouble walking.  E is for eyes. Signs include: ? A sudden change in vision. ? Trouble seeing.  F is for face. Signs include: ? Sudden weakness or numbness of the face. ? The face or eyelid drooping to one side.  A is for arms. Signs include: ? Sudden weakness or numbness of the arm, usually on one side of the body.  S is for speech. Signs include: ? Trouble speaking (aphasia). ? Trouble understanding.  T is for time. ? These symptoms may represent a serious problem that is an emergency. Do not wait to see if the symptoms will go away. Get medical help right away. Call your local emergency services (911 in the [place] Do not drive yourself to the hospital.  Other signs of stroke may include: ? A sudden, severe headache with no known cause. ? Nausea or vomiting. ? Seizure. Where to find more information For more information, visit:  American Stroke Association: www.strokeassociation.org  National Stroke Association: www.stroke.org Summary  You can prevent a stroke by eating healthy, exercising, not smoking,  limiting alcohol intake, and managing any medical conditions you may have.  Do not use any products that contain nicotine or tobacco, such as cigarettes and e-cigarettes. If you need help quitting, ask your health care provider. It may also be helpful to avoid exposure to secondhand smoke.  Remember BEFAST for warning signs of stroke. Get help right away if you or a loved one has any of these signs. This information is not intended to replace advice given to you by your health care provider. Make sure you discuss any questions you have with your health care provider. Document Revised: 06/20/2017 Document Reviewed: 08/13/2016 Elsevier Patient Education  2020 [employer].

## 2020-04-21 ENCOUNTER — Ambulatory Visit (HOSPITAL_COMMUNITY): Payer: 59 | Attending: Cardiology

## 2020-04-21 ENCOUNTER — Other Ambulatory Visit: Payer: Self-pay

## 2020-04-21 DIAGNOSIS — R200 Anesthesia of skin: Secondary | ICD-10-CM | POA: Diagnosis not present

## 2020-04-21 LAB — ECHOCARDIOGRAM COMPLETE
Area-P 1/2: 2.99 cm2
S' Lateral: 3 cm

## 2020-04-24 NOTE — Progress Notes (Signed)
Kindly inform the patient that echocardiogram study shows normal pumping function of the heart and normal functioning of the valves.  Nothing to worry about

## 2020-04-25 ENCOUNTER — Telehealth: Payer: Self-pay | Admitting: *Deleted

## 2020-04-25 NOTE — Telephone Encounter (Signed)
Called and spoke with pt about results per Dr. Sethi note. Pt verbalized understanding. 

## 2020-04-25 NOTE — Telephone Encounter (Signed)
-----   Message from Garvin Fila, MD sent at 04/24/2020  8:38 AM EDT ----- Stacey Calhoun inform the patient that echocardiogram study shows normal pumping function of the heart and normal functioning of the valves.  Nothing to worry about

## 2020-05-17 ENCOUNTER — Other Ambulatory Visit: Payer: Self-pay | Admitting: Gastroenterology

## 2020-06-01 ENCOUNTER — Encounter (HOSPITAL_COMMUNITY): Payer: Self-pay | Admitting: Gastroenterology

## 2020-06-01 ENCOUNTER — Other Ambulatory Visit: Payer: Self-pay

## 2020-06-20 ENCOUNTER — Other Ambulatory Visit (HOSPITAL_COMMUNITY)
Admission: RE | Admit: 2020-06-20 | Discharge: 2020-06-20 | Disposition: A | Discharge status: Home / Self Care | Payer: 59 | Source: Ambulatory Visit | Attending: Gastroenterology | Admitting: Gastroenterology

## 2020-06-20 DIAGNOSIS — Z20822 Contact with and (suspected) exposure to covid-19: Secondary | ICD-10-CM | POA: Diagnosis not present

## 2020-06-20 DIAGNOSIS — Z01812 Encounter for preprocedural laboratory examination: Secondary | ICD-10-CM | POA: Diagnosis present

## 2020-06-20 LAB — SARS CORONAVIRUS 2 (TAT 6-24 HRS): SARS Coronavirus 2: NEGATIVE

## 2020-06-23 ENCOUNTER — Encounter (HOSPITAL_COMMUNITY): Payer: Self-pay | Admitting: Gastroenterology

## 2020-06-23 ENCOUNTER — Ambulatory Visit (HOSPITAL_COMMUNITY)
Admission: RE | Admit: 2020-06-23 | Discharge: 2020-06-23 | Disposition: A | Discharge status: Home / Self Care | Payer: 59 | Attending: Gastroenterology | Admitting: Gastroenterology

## 2020-06-23 ENCOUNTER — Ambulatory Visit (HOSPITAL_COMMUNITY): Payer: 59 | Admitting: Certified Registered Nurse Anesthetist

## 2020-06-23 ENCOUNTER — Encounter (HOSPITAL_COMMUNITY)
Admission: RE | Disposition: A | Discharge status: Home / Self Care | Payer: Self-pay | Source: Home / Self Care | Attending: Gastroenterology

## 2020-06-23 ENCOUNTER — Other Ambulatory Visit: Payer: Self-pay

## 2020-06-23 DIAGNOSIS — R12 Heartburn: Secondary | ICD-10-CM | POA: Diagnosis present

## 2020-06-23 DIAGNOSIS — Z9049 Acquired absence of other specified parts of digestive tract: Secondary | ICD-10-CM | POA: Diagnosis not present

## 2020-06-23 DIAGNOSIS — Z888 Allergy status to other drugs, medicaments and biological substances status: Secondary | ICD-10-CM | POA: Insufficient documentation

## 2020-06-23 DIAGNOSIS — K219 Gastro-esophageal reflux disease without esophagitis: Secondary | ICD-10-CM | POA: Insufficient documentation

## 2020-06-23 DIAGNOSIS — Z882 Allergy status to sulfonamides status: Secondary | ICD-10-CM | POA: Insufficient documentation

## 2020-06-23 DIAGNOSIS — D6851 Activated protein C resistance: Secondary | ICD-10-CM | POA: Insufficient documentation

## 2020-06-23 DIAGNOSIS — K449 Diaphragmatic hernia without obstruction or gangrene: Secondary | ICD-10-CM | POA: Insufficient documentation

## 2020-06-23 DIAGNOSIS — Z79899 Other long term (current) drug therapy: Secondary | ICD-10-CM | POA: Diagnosis not present

## 2020-06-23 HISTORY — DX: Presence of spectacles and contact lenses: Z97.3

## 2020-06-23 HISTORY — PX: ESOPHAGOGASTRODUODENOSCOPY (EGD) WITH PROPOFOL: SHX5813

## 2020-06-23 HISTORY — PX: BRAVO PH STUDY: SHX5421

## 2020-06-23 HISTORY — DX: Essential (primary) hypertension: I10

## 2020-06-23 SURGERY — ESOPHAGOGASTRODUODENOSCOPY (EGD) WITH PROPOFOL
Anesthesia: Monitor Anesthesia Care

## 2020-06-23 MED ORDER — PROPOFOL 10 MG/ML IV BOLUS
INTRAVENOUS | Status: AC
  Filled 2020-06-23: qty 20

## 2020-06-23 MED ORDER — LIDOCAINE 2% (20 MG/ML) 5 ML SYRINGE
INTRAMUSCULAR | Status: DC | PRN
  Administered 2020-06-23: 100 mg via INTRAVENOUS

## 2020-06-23 MED ORDER — LACTATED RINGERS IV SOLN: INTRAVENOUS | Status: DC

## 2020-06-23 MED ORDER — SODIUM CHLORIDE 0.9 % IV SOLN: INTRAVENOUS | Status: DC

## 2020-06-23 MED ORDER — PROPOFOL 500 MG/50ML IV EMUL
INTRAVENOUS | Status: DC | PRN
  Administered 2020-06-23: 125 ug/kg/min via INTRAVENOUS

## 2020-06-23 MED ORDER — PROPOFOL 10 MG/ML IV BOLUS
INTRAVENOUS | Status: DC | PRN
  Administered 2020-06-23: 20 mg via INTRAVENOUS
  Administered 2020-06-23: 30 mg via INTRAVENOUS

## 2020-06-23 MED ORDER — PROPOFOL 500 MG/50ML IV EMUL
INTRAVENOUS | Status: AC
  Filled 2020-06-23: qty 50

## 2020-06-23 SURGICAL SUPPLY — 14 items

## 2020-06-23 NOTE — Transfer of Care (Signed)
Immediate Anesthesia Transfer of Care Note  Patient: Stacey Calhoun  Procedure(s) Performed: Procedure(s): ESOPHAGOGASTRODUODENOSCOPY (EGD) WITH PROPOFOL (N/A) BRAVO PH STUDY (N/A)  Patient Location: PACU and Endoscopy Unit  Anesthesia Type:MAC   Level of Consciousness: awake, alert  and oriented  Airway & Oxygen Therapy: Patient Spontanous Breathing and Patient connected to nasal cannula oxygen  Post-op Assessment: Report given to RN and Post -op Vital signs reviewed and stable  Post vital signs: Reviewed and stable  Last Vitals:  Vitals:   06/23/20 1133  BP: (!) 149/96  Pulse: 75  Resp: 18  Temp: 36.6 C  SpO2: 701%    Complications: No apparent anesthesia complications

## 2020-06-23 NOTE — H&P (Signed)
Stacey Calhoun HPI: This 64 year old white female presents to the office for further evaluation of worsening acid reflux. She has a sensation of a lump in her throat, belching, regurgitation and epigastric pain that started after her laparoscopic appendectomy in December, 2020. The epigastric pain does not radiate but is often a sharp fleeting pain. The severity has reached 7/10 at its worst. She has a cough that started in May of this year. She started Pantoprazole 40 mg in August and Pepcid 20 mg in September for acid reflux with no relief. She also has tried Omeprazole 40 mg in the past which did not help her symptoms. She denies having any dysphagia or odynophagia. She has 2-4 BM's per day with no obvious blood or mucus in the stool. She was noted to be heterozygous for Factor V Leiden mutation in 2017 by her PCP Dr. Crist Calhoun but I do not have any details on this we are trying to procure records from Dr. Silvestre Calhoun office. She has good appetite. She has gained 16 pounds over 3 years. She denies any complaints of nausea, or vomiting. She denies having a family history of colon cancer, or celiac sprue. Her father had ulcerative colitis. Her last colonoscopy was done on 06/24/2016 which revealed sigmoid diverticulosis and biopsies of the cecum were benign. 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   Past Medical History:  Diagnosis Date  . Diverticulitis   . Fibroid   . Hypertension   . IBS (irritable bowel syndrome)   . Interstitial cystitis   . Menorrhagia   . Migraines   . Osteoporosis    on reclast  . Thyroid nodule    u/s 2011 neg  . Wears glasses     Past Surgical History:  Procedure Laterality Date  . APPENDECTOMY    . BREAST SURGERY  3500,9381   fibroadenoma in rt & left breast  . CHOLECYSTECTOMY    . TONSILLECTOMY    . VAGINAL HYSTERECTOMY  2005    Family History  Problem Relation Age of Onset  . Hypertension Mother   . Osteoporosis Mother   .  Stroke Mother   . Thyroid disease Mother   . Cancer Father        bladder  . Hypertension Father   . Stroke Father   . Hypertension Sister   . Hypertension Brother   . Other Brother        Factor 3  . Diabetes Maternal Grandmother   . Autoimmune disease Neg Hx     Social History:  reports that she has never smoked. She has never used smokeless tobacco. She reports that she does not drink alcohol and does not use drugs.  Allergies:  Allergies  Allergen Reactions  . Singulair [Montelukast] Other (See Comments)    Headaches, irritability   . Sulfa Antibiotics Rash    Medications:  Scheduled:  Continuous: . lactated ringers      No results found for this or any previous visit (from the past 24 hour(s)).   No results found.  ROS:  As stated above in the HPI otherwise negative.  Blood pressure (!) 149/96, pulse 75, temperature 97.9 F (36.6 C), temperature source Oral, resp. rate 18, height 5' 3.5" (1.613 m), weight 63.5 kg, last menstrual period 07/23/2003, SpO2 100 %.    PE: Gen: NAD, Alert and Oriented HEENT:  [place]/AT, EOMI Neck: Supple, no LAD Lungs: CTA Bilaterally CV: RRR without M/G/R ABD: Soft, NTND, +BS  Ext: No C/C/E  Assessment/Plan: 1) GERD - The EGD with Bravo will be performed on therapy.  It is not the optimal choice, but she is not able to stop any antacids for two weeks.  Stacey Calhoun D 06/23/2020, 11:52 AM

## 2020-06-23 NOTE — Anesthesia Postprocedure Evaluation (Signed)
Anesthesia Post Note  Patient: Mashell Sieben  Procedure(s) Performed: ESOPHAGOGASTRODUODENOSCOPY (EGD) WITH PROPOFOL (N/A ) BRAVO PH STUDY (N/A )     Patient location during evaluation: Endoscopy Anesthesia Type: MAC Level of consciousness: awake and alert Pain management: pain level controlled Vital Signs Assessment: post-procedure vital signs reviewed and stable Respiratory status: spontaneous breathing, nonlabored ventilation, respiratory function stable and patient connected to nasal cannula oxygen Cardiovascular status: stable and blood pressure returned to baseline Postop Assessment: no apparent nausea or vomiting Anesthetic complications: no   No complications documented.  Last Vitals:  Vitals:   06/23/20 1245 06/23/20 1255  BP: (!) 112/58 119/62  Pulse: 65 66  Resp: 18 13  Temp:    SpO2: 100% 100%    Last Pain:  Vitals:   06/23/20 1255  TempSrc:   PainSc: 0-No pain                 Belenda Cruise P Piya Mesch

## 2020-06-23 NOTE — Anesthesia Preprocedure Evaluation (Signed)
Anesthesia Evaluation  Patient identified by MRN, date of birth, ID band Patient awake    Reviewed: Patient's Chart, lab work & pertinent test results  Airway Mallampati: II  TM Distance: >3 FB Neck ROM: Full    Dental  (+) Teeth Intact   Pulmonary neg pulmonary ROS,    Pulmonary exam normal        Cardiovascular hypertension, Pt. on medications  Rhythm:Regular Rate:Normal     Neuro/Psych  Headaches, negative psych ROS   GI/Hepatic Neg liver ROS, GERD  Medicated,  Endo/Other  negative endocrine ROS  Renal/GU negative Renal ROS  negative genitourinary   Musculoskeletal negative musculoskeletal ROS (+)   Abdominal (+)  Abdomen: soft. Bowel sounds: normal.  Peds  Hematology negative hematology ROS (+)   Anesthesia Other Findings   Reproductive/Obstetrics                             Anesthesia Physical Anesthesia Plan  ASA: II  Anesthesia Plan: MAC   Post-op Pain Management:    Induction: Intravenous  PONV Risk Score and Plan: 2 and Propofol infusion and Treatment may vary due to age or medical condition  Airway Management Planned: Simple Face Mask, Natural Airway and Nasal Cannula  Additional Equipment: None  Intra-op Plan:   Post-operative Plan:   Informed Consent: I have reviewed the patients History and Physical, chart, labs and discussed the procedure including the risks, benefits and alternatives for the proposed anesthesia with the patient or authorized representative who has indicated his/her understanding and acceptance.     Dental advisory given  Plan Discussed with: CRNA  Anesthesia Plan Comments:         Anesthesia Quick Evaluation

## 2020-06-23 NOTE — Op Note (Signed)
[facility] Patient Name: Stacey Calhoun Procedure Date: 06/23/2020 MRN: [id number] Attending MD: Carol Ada , MD Date of Birth: 03/26/1956 CSN: [id number] Age: 64 Admit Type: Outpatient Procedure:                Upper GI endoscopy Indications:              Heartburn Providers:                Carol Ada, MD, Cleda Daub, RN, Tyna Jaksch                            Technician Referring MD:              Medicines:                Propofol per Anesthesia Complications:            No immediate complications. Estimated Blood Loss:     Estimated blood loss: none. Procedure:                Pre-Anesthesia Assessment:                           - Prior to the procedure, a History and Physical                            was performed, and patient medications and                            allergies were reviewed. The patient's tolerance of                            previous anesthesia was also reviewed. The risks                            and benefits of the procedure and the sedation                            options and risks were discussed with the patient.                            All questions were answered, and informed consent                            was obtained. Prior Anticoagulants: The patient has                            taken no previous anticoagulant or antiplatelet                            agents. ASA Grade Assessment: II - A patient with                            mild systemic disease. After reviewing the risks                            and benefits,  the patient was deemed in                            satisfactory condition to undergo the procedure.                           - Sedation was administered by an anesthesia                            professional. Deep sedation was attained.                           After obtaining informed consent, the endoscope was                            passed under direct vision. Throughout the                             procedure, the patient's blood pressure, pulse, and                            oxygen saturations were monitored continuously. The                            GIF-H190 ([id number]) Olympus gastroscope was                            introduced through the mouth, and advanced to the                            second part of duodenum. The upper GI endoscopy was                            accomplished without difficulty. The patient                            tolerated the procedure well. Scope In: Scope Out: Findings:      A 2-3 cm sliding hiatal hernia was present. The BRAVO capsule with       delivery system was introduced through the mouth and advanced into the       esophagus, such that the BRAVO pH capsule was positioned 30 cm from the       incisors, which was 6 cm proximal to the GE junction. The BRAVO pH       capsule was then deployed and attached to the esophageal mucosa. The       delivery system was then withdrawn. Endoscopy was utilized for probe       placement and diagnostic evaluation.      The stomach was normal.      The examined duodenum was normal. Impression:               - 2-3 cm sliding hiatal hernia.                           - Normal stomach.                           -  Normal examined duodenum.                           - The BRAVO pH capsule was deployed.                           - No specimens collected. Moderate Sedation:      Not Applicable - Patient had care per Anesthesia. Recommendation:           - Patient has a contact number available for                            emergencies. The signs and symptoms of potential                            delayed complications were discussed with the                            patient. Return to normal activities tomorrow.                            Written discharge instructions were provided to the                            patient.                           - Resume previous diet.                            - Continue present medications.                           - Return to GI clinic in 2 weeks with Dr. Collene Mares. Procedure Code(s):        --- Professional ---                           [phone], Esophagogastroduodenoscopy, flexible,                            transoral; diagnostic, including collection of                            specimen(s) by brushing or washing, when performed                            (separate procedure) Diagnosis Code(s):        --- Professional ---                           R12, Heartburn                           K44.9, Diaphragmatic hernia without obstruction or                            gangrene CPT copyright 2019 American  Medical Association. All rights reserved. The codes documented in this report are preliminary and upon coder review may  be revised to meet current compliance requirements. Carol Ada, MD Carol Ada, MD 06/23/2020 12:35:19 PM This report has been signed electronically. Number of Addenda: 0

## 2020-06-23 NOTE — Discharge Instructions (Signed)

## 2020-06-27 ENCOUNTER — Encounter (HOSPITAL_COMMUNITY): Payer: Self-pay | Admitting: Gastroenterology

## 2020-07-25 IMAGING — MG DIGITAL DIAGNOSTIC BILAT W/ TOMO W/ CAD
6 of 10 series · 6 of 30 positions shown · non-contrast
Comparison: Previous examinations, including the chest CT dated
08/07/2019.

CLINICAL DATA: Multiple small masses in the right breast on a
recent chest CT.

EXAM:
DIGITAL DIAGNOSTIC BILATERAL MAMMOGRAM WITH CAD AND TOMO

[R CC synth-2D]
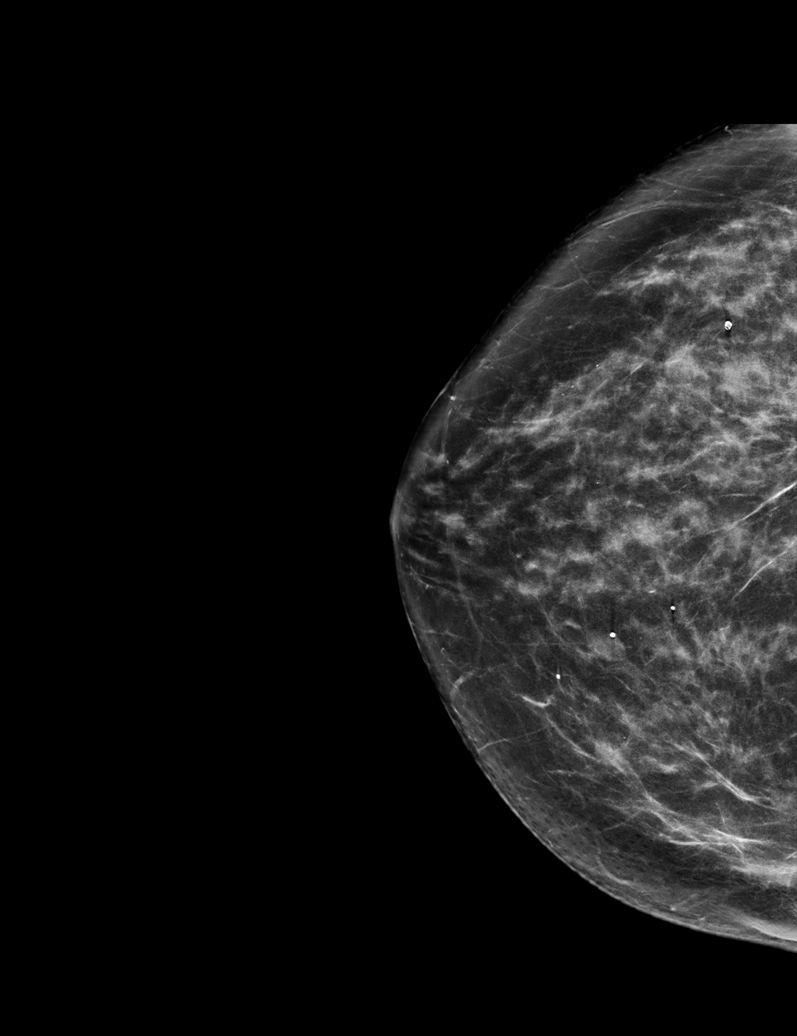

[R MLO synth-2D (1 of 2)]
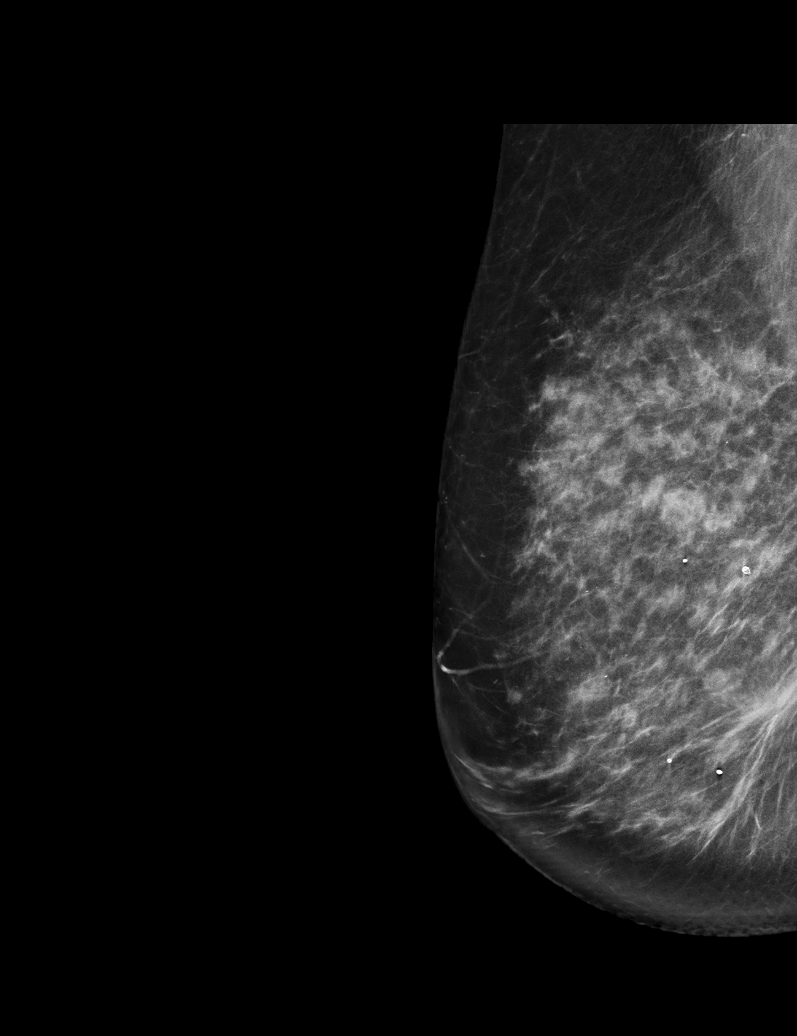

[L CC synth-2D]
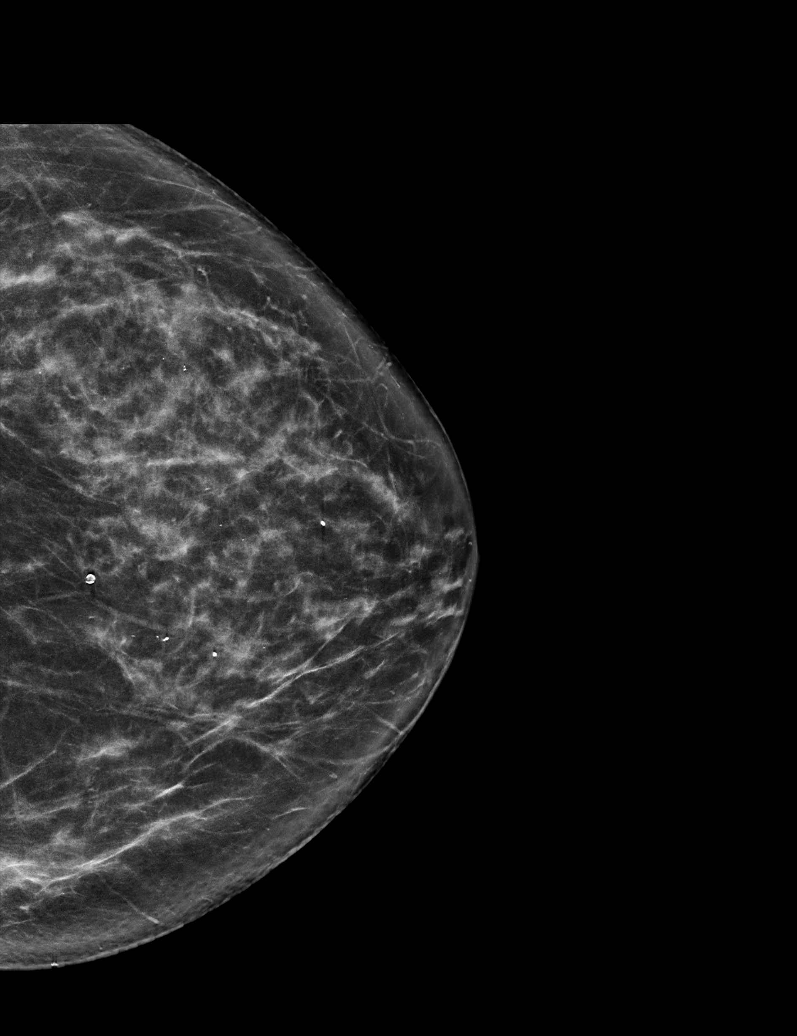

[R MLO synth-2D (2 of 2)]
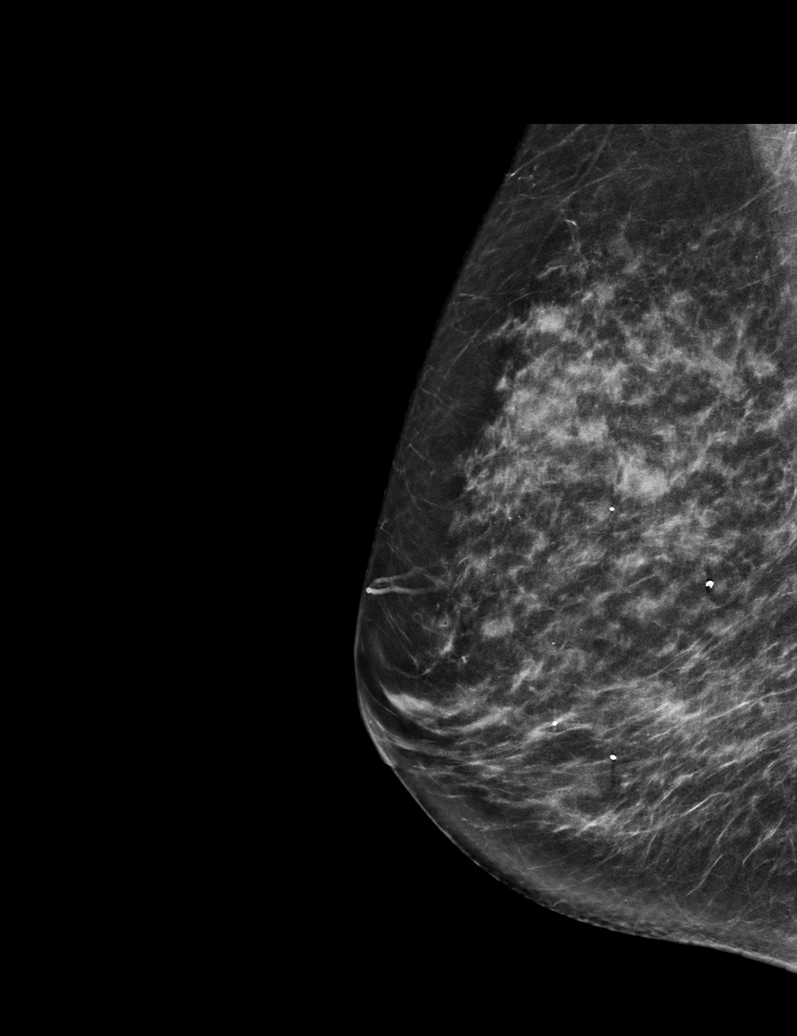

[L MLO synth-2D]
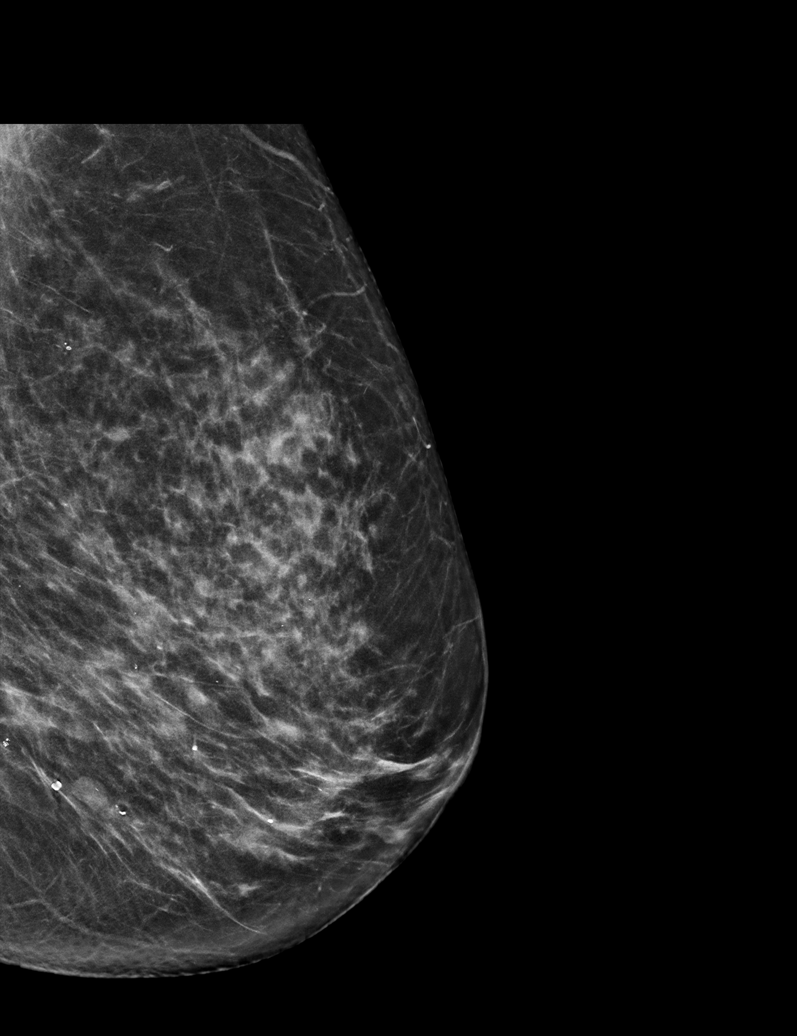

[R CC tomo · tomo slice 35/69.0]
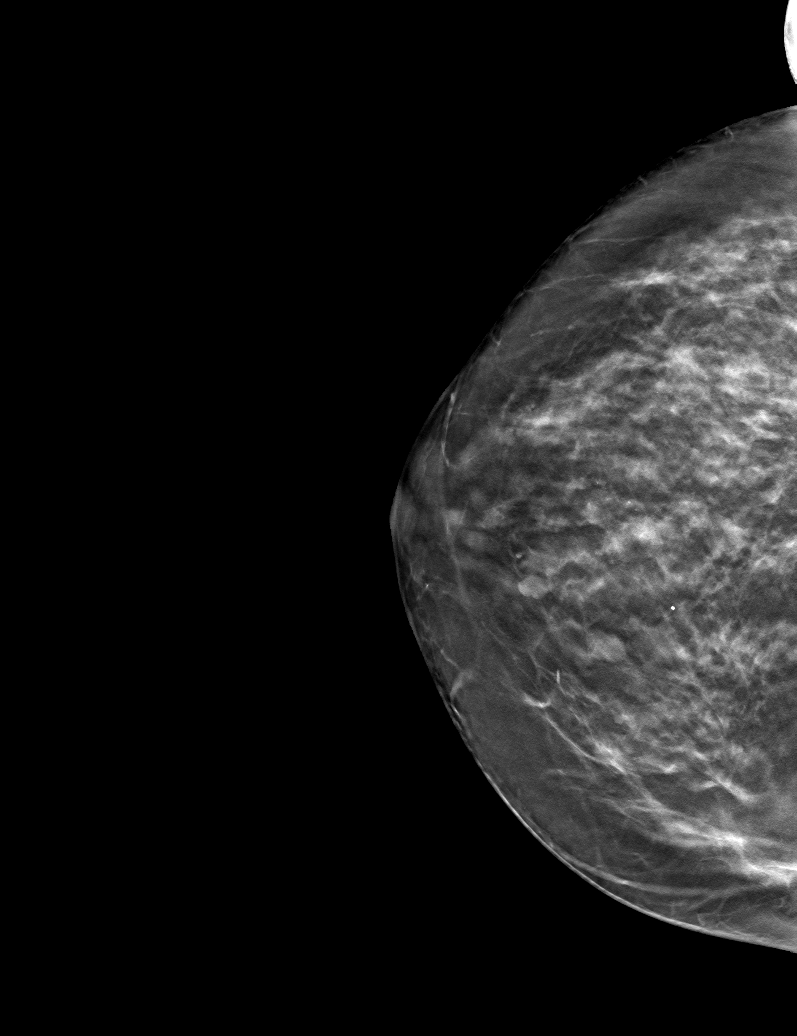

[6 of 30 positions shown; findings below may reference images not displayed]

ACR Breast Density Category c: The breast tissue is heterogeneously
dense, which may obscure small masses.
FINDINGS: Multiple waxing and waning rounded and oval circumscribed masses in
both breasts. No findings suspicious for malignancy in either
breast.

Mammographic images were processed with CAD.
IMPRESSION: Multiple bilateral benign breast masses compatible with waxing and
waning cysts. No evidence of malignancy.

RECOMMENDATION:
Bilateral screening mammogram in 1 year.

I have discussed the findings and recommendations with the patient.
If applicable, a reminder letter will be sent to the patient
regarding the next appointment.

BI-RADS CATEGORY  2: Benign.

## 2020-10-17 ENCOUNTER — Other Ambulatory Visit: Payer: Self-pay | Admitting: Internal Medicine

## 2020-10-17 DIAGNOSIS — Z1231 Encounter for screening mammogram for malignant neoplasm of breast: Secondary | ICD-10-CM

## 2020-12-08 ENCOUNTER — Other Ambulatory Visit: Payer: Self-pay

## 2020-12-08 ENCOUNTER — Ambulatory Visit
Admission: RE | Admit: 2020-12-08 | Discharge: 2020-12-08 | Disposition: A | Discharge status: Home / Self Care | Payer: 59 | Source: Ambulatory Visit | Attending: Internal Medicine | Admitting: Internal Medicine

## 2020-12-08 DIAGNOSIS — Z1231 Encounter for screening mammogram for malignant neoplasm of breast: Secondary | ICD-10-CM

## 2021-01-04 IMAGING — MR MR HEAD W/ CM
4 of 6 series · 20 of 48 positions shown · IV contrast (gadavist)
Comparison: Noncontrast brain MRI performed earlier the same day
01/28/2020, head CT 01/28/2020

CLINICAL DATA: Provided history: Multiple sclerosis, new event.
Additional provided: Concern for MS.

EXAM:
MRI HEAD WITH CONTRAST
TECHNIQUE: Multiplanar, multiecho pulse sequences of the brain and surrounding
structures were obtained with intravenous contrast.
CONTRAST:  6.9mL GADAVIST GADOBUTROL 1 MMOL/ML IV SOLN

[Series 5: t2_space_dark-fluid_sag_p2_ns-ir · sagittal · 1.0mm · 0.49mm/px · 9 of 144 slices shown]
[im 1/144]
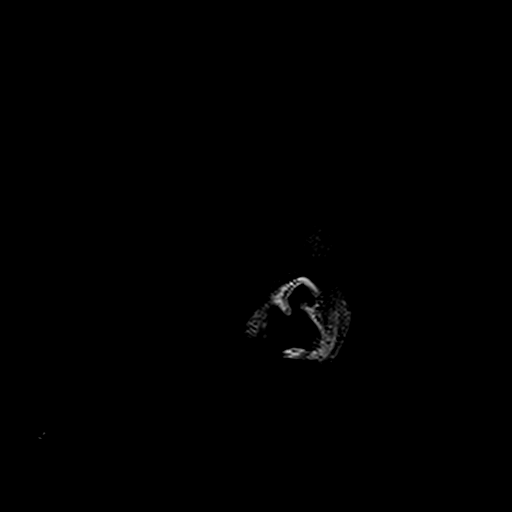
[im 27/144]
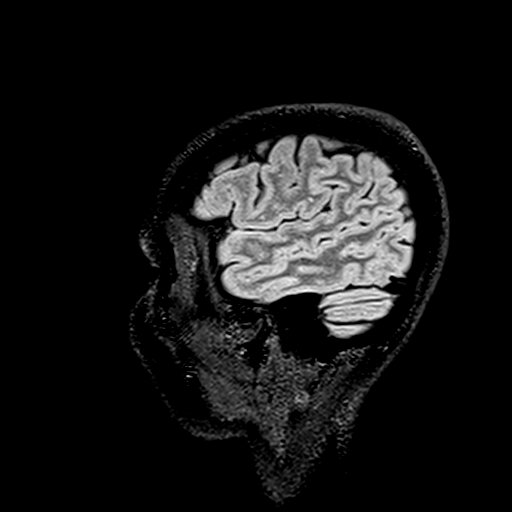
[im 40/144]
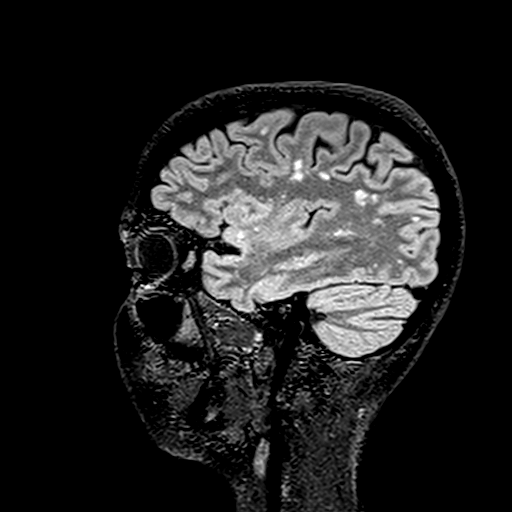
[im 66/144]
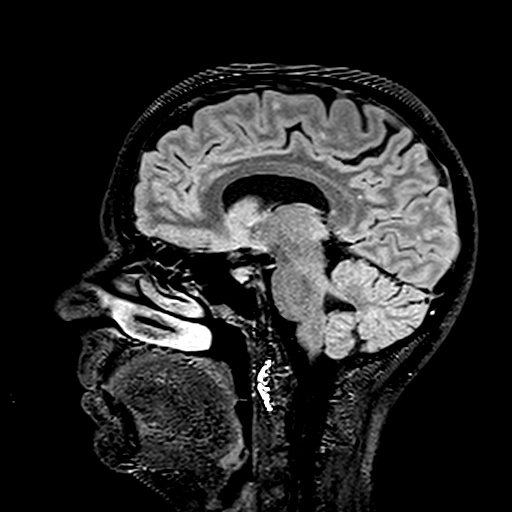
[im 79/144]
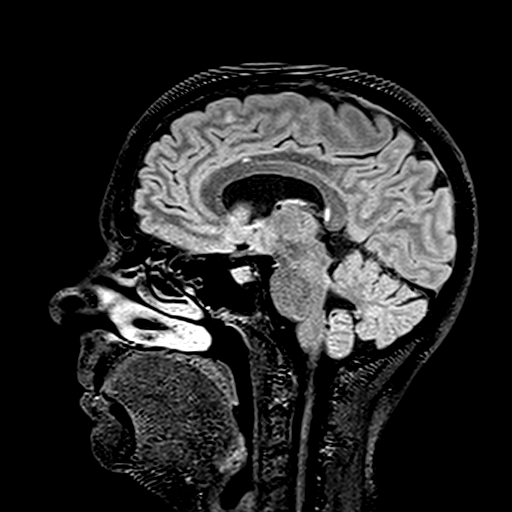
[im 105/144]
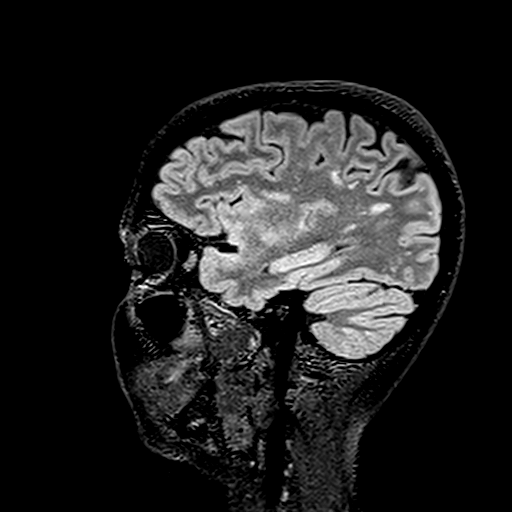
[im 118/144]
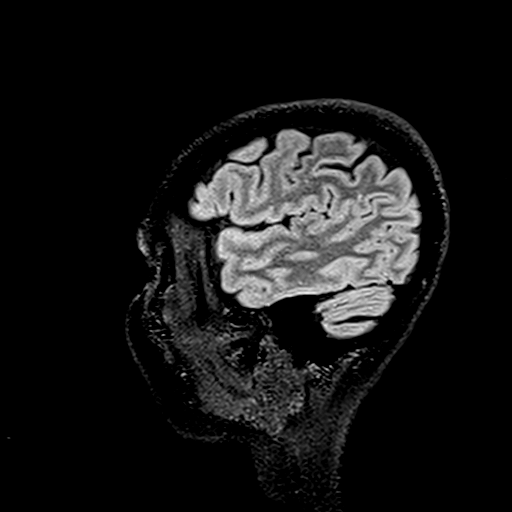
[im 131/144]
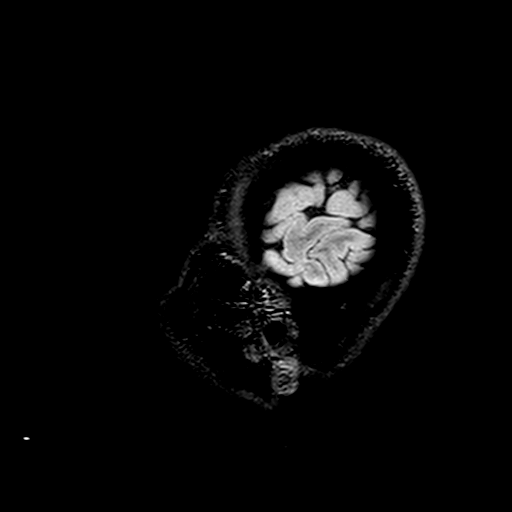
[im 144/144]
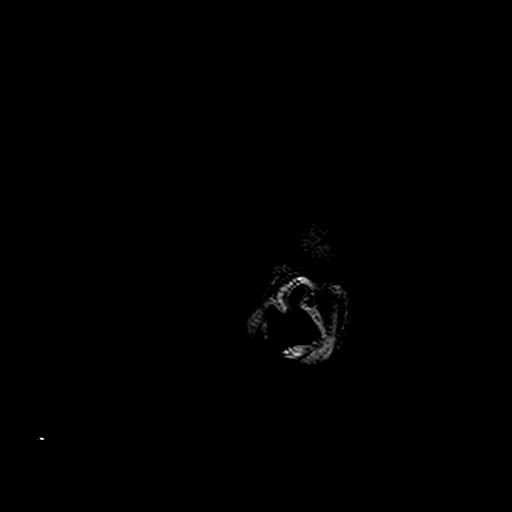

[Series 6: t2_space_dark-fluid_sag_p2_ns-ir_mpr_ axial · axial · 1.0mm · 0.45mm/px · z∈[-97,+38]mm · 7 of 150 slices shown]
[im 1/150]
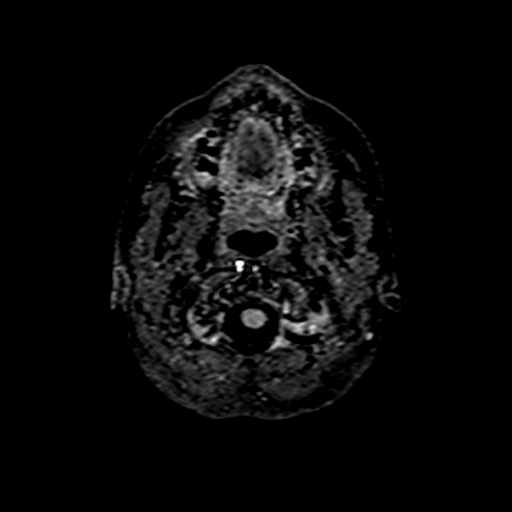
[im 28/150]
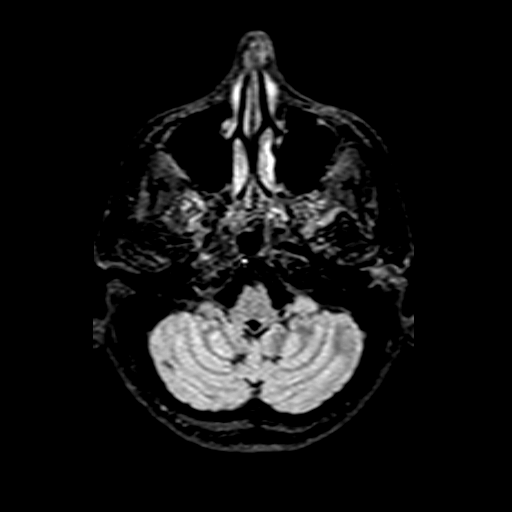
[im 41/150]
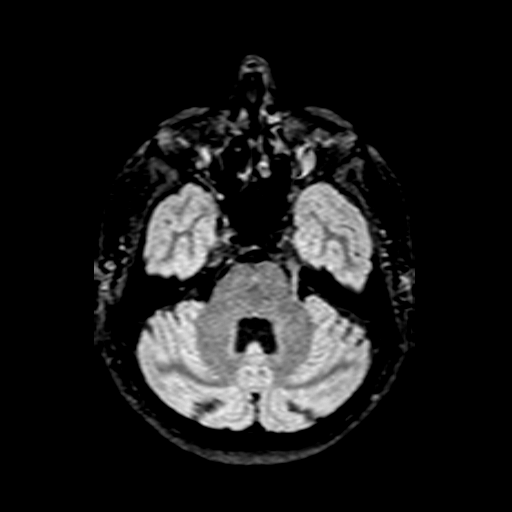
[im 68/150]
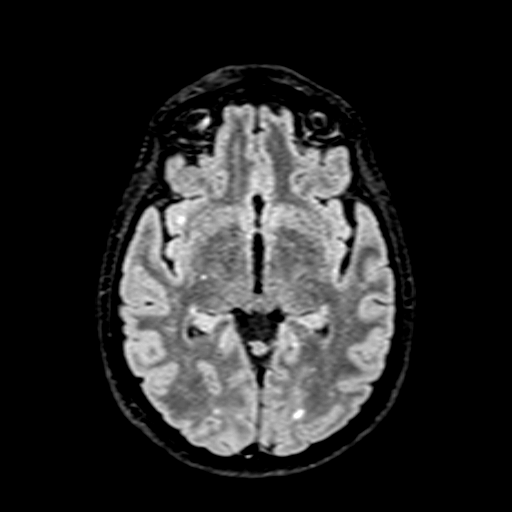
[im 82/150]
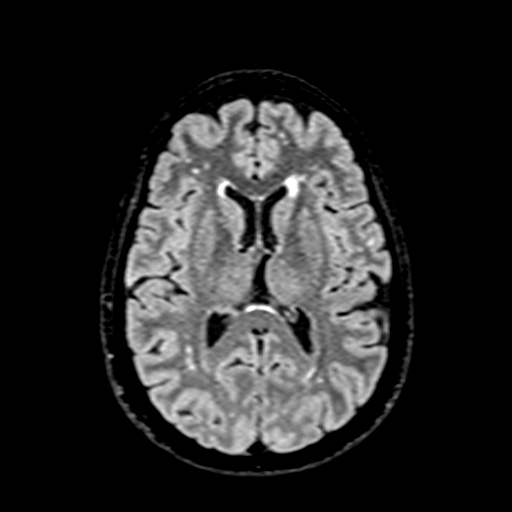
[im 109/150]
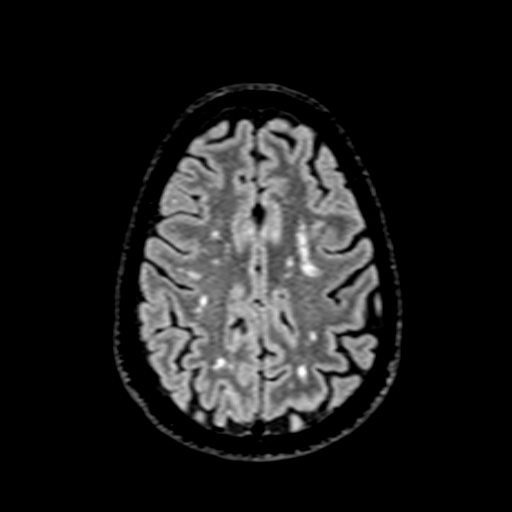
[im 136/150]
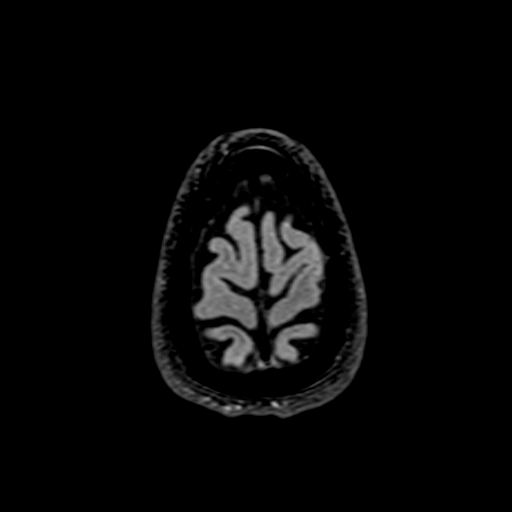

[Series 9: T1 post-contrast · coronal · 5.0mm · 0.43mm/px · 2 of 30 slices shown (1 of 2)]
[im 1/30]
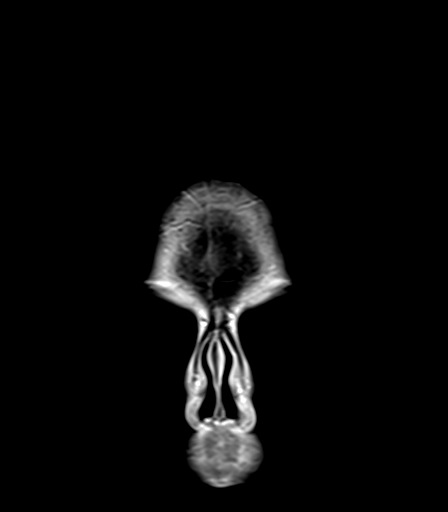
[im 30/30]
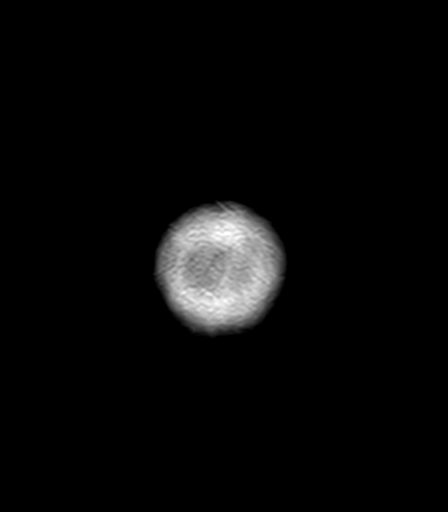

[Series 10: T1 post-contrast · sagittal · 5.0mm · 0.72mm/px · 2 of 24 slices shown (2 of 2)]
[im 1/24]
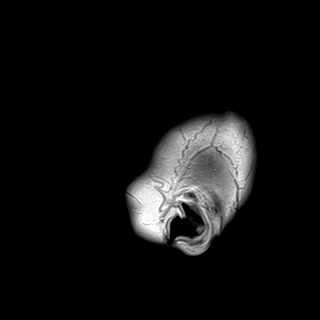
[im 24/24]
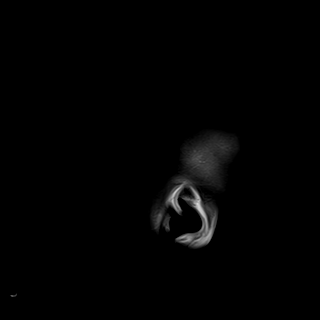

[20 of 48 positions shown; findings below may reference images not displayed]

FINDINGS: Brain:

A limited protocol pre and postcontrast brain MRI was performed as a
follow-up to the noncontrast brain MRI performed earlier the same
day. The current study consists of a 3D T2/FLAIR sequence as well as
axial, coronal and sagittal postcontrast T1 weighted imaging.

Unchanged multifocal T2 hyperintensity within the
subcortical/juxtacortical and deep periventricular white matter as
compared to the noncontrast brain MRI performed earlier the same
day.

No abnormal intracranial enhancement is identified.

Vascular: Expected enhancement within the proximal large arterial
vessels and dural venous sinuses.

Skull and upper cervical spine: No enhancing calvarial lesion is
identified.

Sinuses/Orbits: No evidence of acute orbital abnormality. Mild
ethmoid sinus mucosal thickening.
IMPRESSION: Redemonstrated moderate patchy T2/FLAIR hyperintense signal
abnormality within the cerebral white matter which is advanced for
age and nonspecific. Differential considerations include chronic
small vessel ischemic disease, a demyelinating process such as
multiple sclerosis, sequelae of a prior infectious/inflammatory
process, vasculitis and migraine headaches, among others.

No abnormal intracranial enhancement is demonstrated.

Mild ethmoid sinus mucosal thickening.

## 2021-03-02 ENCOUNTER — Other Ambulatory Visit: Payer: Self-pay

## 2021-03-02 ENCOUNTER — Encounter: Payer: Self-pay | Admitting: Primary Care

## 2021-03-02 ENCOUNTER — Telehealth: Payer: Self-pay | Admitting: Primary Care

## 2021-03-02 ENCOUNTER — Ambulatory Visit (INDEPENDENT_AMBULATORY_CARE_PROVIDER_SITE_OTHER): Payer: 59 | Admitting: Primary Care

## 2021-03-02 DIAGNOSIS — R0602 Shortness of breath: Secondary | ICD-10-CM

## 2021-03-02 NOTE — Progress Notes (Signed)
$'@Patient'q$  ID: Stacey Calhoun, female    DOB: 19-Feb-1956, 65 y.o.   MRN: [id number]  No chief complaint on file.   Referring provider: Crist Infante, MD  HPI: 65 year old female, never smoked.  Past medical history significant for chronic sinusitis, pleural effusion.  Patient of Dr. Shearon Stalls, last seen in office by pulmonary nurse practitioner on 02/11/2020.    03/02/2021 Patient presents today for overdue follow-up. She had CXR with her PCP after having URI symptoms this past winter that showed hyperinflated lungs. She has been having more shortness of breath over the last several months. She also has a dry cough and chest tightness with activity. She will use albuterol on occasion with temporary improvement. Pulmonary function testing in July 2021 showed normal spirometry.  Allergies  Allergen Reactions   Aspirin Other (See Comments)   Bupropion Other (See Comments)   Cefdinir Other (See Comments)   Escitalopram Oxalate Other (See Comments)   Ibuprofen Other (See Comments)   Montelukast Sodium Other (See Comments)   Sertraline Hcl Other (See Comments)   Singulair [Montelukast] Other (See Comments)    Headaches, irritability    Sulfa Antibiotics Rash and Other (See Comments)    Immunization History  Administered Date(s) Administered   Influenza Split 05/27/2011, 08/14/2011, 08/18/2012   Influenza, High Dose Seasonal PF 06/08/2014, 05/23/2015, 04/30/2019   Influenza,inj,Quad PF,6+ Mos 05/02/2018   PFIZER(Purple Top)SARS-COV-2 Vaccination 08/28/2019, 09/22/2019   Pneumococcal Polysaccharide-23 05/27/2011, 01/23/2021   Td 01/05/2020   Zoster Recombinat (Shingrix) 07/18/2017   Zoster, Live 05/27/2011    Past Medical History:  Diagnosis Date   Diverticulitis    Fibroid    Hypertension    IBS (irritable bowel syndrome)    Interstitial cystitis    Menorrhagia    Migraines    Osteoporosis    on reclast   Thyroid nodule    u/s 2011 neg   Wears glasses     Tobacco  History: Social History   Tobacco Use  Smoking Status Never  Smokeless Tobacco Never   Counseling given: Not Answered   Outpatient Medications Prior to Visit  Medication Sig Dispense Refill   albuterol (VENTOLIN HFA) 108 (90 Base) MCG/ACT inhaler Inhale into the lungs every 6 (six) hours as needed for wheezing or shortness of breath.     atorvastatin (LIPITOR) 10 MG tablet Take 1 tablet (10 mg total) by mouth daily. 30 tablet 11   cetirizine (ZYRTEC) 10 MG tablet Take 10 mg by mouth daily.     Cholecalciferol (VITAMIN D) 125 MCG (5000 UT) CAPS Take 5,000 Units by mouth daily.     Dextran 70-Hypromellose (ARTIFICIAL TEARS PF OP) Place 1 drop into both eyes daily as needed (For dry eyes).      doxylamine, Sleep, (UNISOM) 25 MG tablet Take 50 mg by mouth at bedtime.     fluticasone (FLONASE) 50 MCG/ACT nasal spray Place 1 spray into both nostrils daily as needed for allergies or rhinitis.     Melatonin 5 MG TABS Take 5 mg by mouth at bedtime.      Multiple Vitamins-Minerals (MULTIVITAMIN WITH MINERALS) tablet Take 1 tablet by mouth daily.     pantoprazole (PROTONIX) 40 MG tablet Take 40 mg by mouth every morning.     Probiotic Product (PROBIOTIC PO) Take 1 capsule by mouth daily.      SUMAtriptan (IMITREX) 50 MG tablet Take 1 tablet (50 mg total) by mouth once. May repeat in 2 hours if headache persists or recurs. (Patient taking differently:  Take 50 mg by mouth every 2 (two) hours as needed. May repeat in 2 hours if headache persists or recurs.) 18 tablet 2   telmisartan (MICARDIS) 20 MG tablet Take 20 mg by mouth daily.     aspirin EC 81 MG tablet Take 1 tablet (81 mg total) by mouth daily. Swallow whole. 30 tablet 11   FLUoxetine (PROZAC) 10 MG tablet Take 10 mg by mouth daily.     No facility-administered medications prior to visit.    Review of Systems  Review of Systems  Constitutional: Negative.   HENT: Negative.    Respiratory:  Positive for cough, chest tightness and  shortness of breath.     Physical Exam  BP 112/70 (BP Location: Left Arm, Patient Position: Sitting, Cuff Size: Normal)   Pulse 86   Temp 97.9 F (36.6 C) (Oral)   Ht 5' 3.5" (1.613 m)   Wt 144 lb 6.4 oz (65.5 kg)   LMP 07/23/2003   SpO2 98%   BMI 25.18 kg/m  Physical Exam Constitutional:      Appearance: Normal appearance.  HENT:     Head: Normocephalic and atraumatic.  Cardiovascular:     Rate and Rhythm: Normal rate and regular rhythm.  Pulmonary:     Effort: Pulmonary effort is normal.     Breath sounds: Normal breath sounds.  Musculoskeletal:        General: Normal range of motion.  Skin:    General: Skin is warm and dry.  Neurological:     General: No focal deficit present.     Mental Status: She is alert and oriented to person, place, and time. Mental status is at baseline.  Psychiatric:        Mood and Affect: Mood normal.        Behavior: Behavior normal.        Thought Content: Thought content normal.        Judgment: Judgment normal.     Lab Results:  CBC    Component Value Date/Time   WBC 9.5 01/28/2020 1039   RBC 4.74 01/28/2020 1039   HGB 14.1 01/28/2020 1039   HCT 44.6 01/28/2020 1039   PLT 404 (H) 01/28/2020 1039   MCV 94.1 01/28/2020 1039   MCH 29.7 01/28/2020 1039   MCHC 31.6 01/28/2020 1039   RDW 14.1 01/28/2020 1039   LYMPHSABS 3.5 01/28/2020 1039   MONOABS 1.1 (H) 01/28/2020 1039   EOSABS 0.2 01/28/2020 1039   BASOSABS 0.1 01/28/2020 1039    BMET    Component Value Date/Time   NA 138 01/28/2020 1039   K 4.0 01/28/2020 1039   CL 99 01/28/2020 1039   CO2 28 01/28/2020 1039   GLUCOSE 97 01/28/2020 1039   BUN 17 01/28/2020 1039   CREATININE 0.65 01/28/2020 1039   CREATININE 0.61 06/06/2014 1200   CALCIUM 9.2 01/28/2020 1039   GFRNONAA >60 01/28/2020 1039   GFRAA >60 01/28/2020 1039    BNP    Component Value Date/Time   BNP 54.1 07/27/2019 0910    ProBNP No results found for: PROBNP  Imaging: No results  found.   Assessment & Plan:   SOB (shortness of breath) - Patient has symptoms of shortness of breath with associated dry cough and chest tightness. Pulmonary function testing was normal in July 2021. Based on her clinical symptoms suspect she has an underlying component of mild intermittent asthma or hyper-reactive airways. Recommend trial Symbicort 17mg 2 puffs BID and continue flonase and Zyrtec. FU  in 3 months with Dr. Shearon Stalls.    Martyn Ehrich, NP 04/20/2021

## 2021-03-02 NOTE — Telephone Encounter (Signed)
Spoke with pt who states [facility] has the generic form of Symbicort and is requesting Dr. Shearon Stalls place order for that med. Dr. Shearon Stalls please advise.

## 2021-03-02 NOTE — Patient Instructions (Addendum)
You can see hyperinflation of the lungs from COPD or asthma. Based on you being a non-smoker, normal breathing test and hx seasonal allergies suspect you could have an underlying component of mild intermittent asthma or hyper-reactive airways. Recommend we try you on low dose steriod inhaler. Continue flonase and Zyrtec.   Recommend you call your insurance and ask what low ICS/LABA inhaler is covered on your plan (examples: Symbicort, Advair, wixela, breo or airduo). And then let us know with either a mychart message or call triage line to let them know and we can send in prescription   Follow-up: 3 months with Dr. Shearon Stalls or sooner if needed

## 2021-03-07 MED ORDER — BUDESONIDE-FORMOTEROL FUMARATE 80-4.5 MCG/ACT IN AERO: 2.0000 | INHALATION_SPRAY | Freq: Two times a day (BID) | RESPIRATORY_TRACT | 3 refills | Status: AC

## 2021-03-07 NOTE — Telephone Encounter (Signed)
Sent to pharmacy 

## 2021-04-06 ENCOUNTER — Other Ambulatory Visit: Payer: Self-pay | Admitting: Neurology

## 2021-04-20 NOTE — Assessment & Plan Note (Addendum)
-   Patient has symptoms of shortness of breath with associated dry cough and chest tightness. Pulmonary function testing was normal in July 2021. Based on her clinical symptoms suspect she has an underlying component of mild intermittent asthma or hyper-reactive airways. Recommend trial Symbicort 49mcg 2 puffs BID and continue flonase and Zyrtec. FU in 3 months with Dr. Shearon Stalls.

## 2021-05-15 ENCOUNTER — Ambulatory Visit (HOSPITAL_COMMUNITY): Payer: 59

## 2021-05-16 ENCOUNTER — Other Ambulatory Visit (HOSPITAL_COMMUNITY): Payer: Self-pay | Admitting: *Deleted

## 2021-05-17 ENCOUNTER — Ambulatory Visit (HOSPITAL_COMMUNITY)
Admission: RE | Admit: 2021-05-17 | Discharge: 2021-05-17 | Disposition: A | Discharge status: Home / Self Care | Payer: 59 | Source: Ambulatory Visit | Attending: Internal Medicine | Admitting: Internal Medicine

## 2021-05-17 ENCOUNTER — Other Ambulatory Visit: Payer: Self-pay

## 2021-05-17 DIAGNOSIS — M810 Age-related osteoporosis without current pathological fracture: Secondary | ICD-10-CM | POA: Diagnosis present

## 2021-05-17 MED ORDER — ZOLEDRONIC ACID 5 MG/100ML IV SOLN
INTRAVENOUS | Status: AC
  Filled 2021-05-17: qty 100

## 2021-05-17 MED ORDER — ZOLEDRONIC ACID 5 MG/100ML IV SOLN
5.0000 mg | Freq: Once | INTRAVENOUS | Status: AC
  Administered 2021-05-17: 5 mg via INTRAVENOUS

## 2021-06-11 ENCOUNTER — Ambulatory Visit (INDEPENDENT_AMBULATORY_CARE_PROVIDER_SITE_OTHER): Payer: 59 | Admitting: Internal Medicine

## 2021-06-11 ENCOUNTER — Encounter: Payer: Self-pay | Admitting: Internal Medicine

## 2021-06-11 ENCOUNTER — Other Ambulatory Visit: Payer: Self-pay

## 2021-06-11 VITALS — BP 116/70 | HR 65 | Temp 98.3°F | Ht 63.5 in | Wt 143.6 lb

## 2021-06-11 DIAGNOSIS — R0602 Shortness of breath: Secondary | ICD-10-CM

## 2021-06-11 LAB — NITRIC OXIDE: Nitric Oxide: 13

## 2021-06-11 NOTE — Progress Notes (Signed)
Stacey Calhoun    [id number]    12-27-1955  Primary Care Physician:Perini, Elta Guadel[place], MD Date of Appointment: 06/11/2021 Established Patient Visit  Chief complaint:   Chief Complaint  Patient presents with   Follow-up    Pt states she has been doing okay since last visit.     HPI: Stacey Calhoun is a 65 y.o. woman who initially presented to see me in 2021 for persistent right-sided pain, and was found to have a persistent loculated pleural effusion felt to be inflammatory in nature after emergency surgery for appendicitis.Underwent chest tube drainage. This resolved.   She later had ongoing symptoms of dyspnea and chest tightness.  Interval Updates: Was seen by beth and started on symbicort inhaler and prn albuterol. Thinks episode were related to allergies. She is feeling tightness in her chest and shortness of breath. Symptoms are almost always with exertion rather than rest. Usually worse in the morning. Denies chest pain.   She couldn't tell a difference with either inhaler. Made better with rest. Symptoms last for about half an hour. Do not wake her up at night.   Has had an endoscopy with GI - does not think this is related to GERD.    I have reviewed the patient's family social and past medical history and updated as appropriate.   Past Medical History:  Diagnosis Date   Diverticulitis    Fibroid    Hypertension    IBS (irritable bowel syndrome)    Interstitial cystitis    Menorrhagia    Migraines    Osteoporosis    on reclast   Thyroid nodule    u/s 2011 neg   Wears glasses     Past Surgical History:  Procedure Laterality Date   APPENDECTOMY     BRAVO [employer] STUDY N/A 06/23/2020   Procedure: BRAVO [employer] STUDY;  Surgeon: Carol Ada, MD;  Location: [facility];  Service: Endoscopy;  Laterality: N/A;   BREAST EXCISIONAL BIOPSY Right    BREAST EXCISIONAL BIOPSY Left    BREAST SURGERY  [id number]   fibroadenoma in rt & left breast    CHOLECYSTECTOMY     ESOPHAGOGASTRODUODENOSCOPY (EGD) WITH PROPOFOL N/A 06/23/2020   Procedure: ESOPHAGOGASTRODUODENOSCOPY (EGD) WITH PROPOFOL;  Surgeon: Carol Ada, MD;  Location: [facility];  Service: Endoscopy;  Laterality: N/A;   TONSILLECTOMY     VAGINAL HYSTERECTOMY  2005    Family History  Problem Relation Age of Onset   Hypertension Mother    Osteoporosis Mother    Stroke Mother    Thyroid disease Mother    Cancer Father        bladder   Hypertension Father    Stroke Father    Hypertension Sister    Hypertension Brother    Other Brother        Factor 3   Diabetes Maternal Grandmother    Autoimmune disease Neg Hx     Social History   Occupational History   Not on file  Tobacco Use   Smoking status: Never   Smokeless tobacco: Never  Vaping Use   Vaping Use: Never used  Substance and Sexual Activity   Alcohol use: No   Drug use: No   Sexual activity: Yes    Partners: Male    Birth control/protection: Surgical    Comment: hysterectomy   Physical Exam: Blood pressure 116/70, pulse 65, temperature 98.3 F (36.8 C), temperature source Oral, height 5' 3.5" (1.613 m), weight 143  lb 9.6 oz (65.1 kg), last menstrual period 07/23/2003, SpO2 100 %.  Gen:      No acute distress Lungs:    ctab no wheezes or crackles,  CV:         RRR no mrg Neuro: normal speech, no focal facial asymmetry  Data Reviewed: Imaging: chest xray July 2021 no acute cardiopulmonary process  Labs: Lab Results  Component Value Date   WBC 9.5 01/28/2020   HGB 14.1 01/28/2020   HCT 44.6 01/28/2020   MCV 94.1 01/28/2020   PLT 404 (H) 01/28/2020   Lab Results  Component Value Date   NA 138 01/28/2020   K 4.0 01/28/2020   CL 99 01/28/2020   CO2 28 01/28/2020     Immunization status: Immunization History  Administered Date(s) Administered   Influenza Split 05/27/2011, 08/14/2011, 08/18/2012   Influenza, High Dose Seasonal PF 06/08/2014, 05/23/2015, 04/30/2019    Influenza,inj,Quad PF,6+ Mos 05/02/2018   PFIZER(Purple Top)SARS-COV-2 Vaccination 08/28/2019, 09/22/2019   Pneumococcal Polysaccharide-23 05/27/2011, 01/23/2021   Td 01/05/2020   Zoster Recombinat (Shingrix) 07/18/2017   Zoster, Live 05/27/2011    Assessment:  Shortness of breath  Plan/Recommendations: Symptoms are not improving with ICS-LABA and prn albuterol. This makes asthma less likely.  FeNO obtained today shows 13 ppb.  Will proceed with methacholine challenge test to assess for bronchial hyperresponsiveness. I am looking to rule out asthma since no response to treatment.   I will call her with results once test is done.   Return to Care: Return in about 2 months (around 08/11/2021).   Lenice Llamas, MD Pulmonary and [place]

## 2021-06-11 NOTE — Patient Instructions (Signed)
Please schedule follow up scheduled with myself in 2 months.  If my schedule is not open yet, we will contact you with a reminder closer to that time.  Before your next visit I would like you to have: Methacholine challenge test.

## 2021-06-27 ENCOUNTER — Other Ambulatory Visit: Payer: Self-pay | Admitting: Internal Medicine

## 2021-06-28 LAB — SARS CORONAVIRUS 2 (TAT 6-24 HRS): SARS Coronavirus 2: NEGATIVE

## 2021-06-29 ENCOUNTER — Ambulatory Visit (HOSPITAL_COMMUNITY)
Admission: RE | Admit: 2021-06-29 | Discharge: 2021-06-29 | Disposition: A | Discharge status: Home / Self Care | Payer: 59 | Source: Ambulatory Visit | Attending: Internal Medicine | Admitting: Internal Medicine

## 2021-06-29 ENCOUNTER — Inpatient Hospital Stay (HOSPITAL_COMMUNITY): Admission: RE | Admit: 2021-06-29 | Payer: 59 | Source: Ambulatory Visit

## 2021-06-29 ENCOUNTER — Other Ambulatory Visit: Payer: Self-pay

## 2021-06-29 DIAGNOSIS — R0602 Shortness of breath: Secondary | ICD-10-CM | POA: Insufficient documentation

## 2021-06-29 LAB — PULMONARY FUNCTION TEST
FEF 25-75 Post: 2.65 L/sec
FEF 25-75 Pre: 2.79 L/sec
FEF2575-%Change-Post: -5 %
FEF2575-%Pred-Post: 120 %
FEF2575-%Pred-Pre: 127 %
FEV1-%Change-Post: -1 %
FEV1-%Pred-Post: 111 %
FEV1-%Pred-Pre: 113 %
FEV1-Post: 2.7 L
FEV1-Pre: 2.74 L
FEV1FVC-%Change-Post: 1 %
FEV1FVC-%Pred-Pre: 105 %
FEV6-%Change-Post: -3 %
FEV6-%Pred-Post: 106 %
FEV6-%Pred-Pre: 110 %
FEV6-Post: 3.24 L
FEV6-Pre: 3.34 L
FEV6FVC-%Pred-Post: 103 %
FEV6FVC-%Pred-Pre: 103 %
FVC-%Change-Post: -3 %
FVC-%Pred-Post: 102 %
FVC-%Pred-Pre: 105 %
FVC-Post: 3.24 L
FVC-Pre: 3.34 L
Post FEV1/FVC ratio: 83 %
Post FEV6/FVC ratio: 100 %
Pre FEV1/FVC ratio: 82 %
Pre FEV6/FVC Ratio: 100 %

## 2021-06-29 MED ORDER — METHACHOLINE 0.0625 MG/ML NEB SOLN
3.0000 mL | Freq: Once | RESPIRATORY_TRACT | Status: AC
  Administered 2021-06-29: 0.0625 mg via RESPIRATORY_TRACT
  Filled 2021-06-29: qty 3

## 2021-06-29 MED ORDER — METHACHOLINE 0 MG/ML NEB SOLN
3.0000 mL | Freq: Once | RESPIRATORY_TRACT | Status: AC
  Administered 2021-06-29: 3 mL via RESPIRATORY_TRACT
  Filled 2021-06-29: qty 3

## 2021-06-29 MED ORDER — METHACHOLINE 0.25 MG/ML NEB SOLN
3.0000 mL | Freq: Once | RESPIRATORY_TRACT | Status: AC
  Administered 2021-06-29: 0.75 mg via RESPIRATORY_TRACT
  Filled 2021-06-29: qty 3

## 2021-06-29 MED ORDER — METHACHOLINE 1 MG/ML NEB SOLN
3.0000 mL | Freq: Once | RESPIRATORY_TRACT | Status: AC
  Administered 2021-06-29: 3 mg via RESPIRATORY_TRACT
  Filled 2021-06-29: qty 3

## 2021-06-29 MED ORDER — ALBUTEROL SULFATE (2.5 MG/3ML) 0.083% IN NEBU
2.5000 mg | INHALATION_SOLUTION | Freq: Once | RESPIRATORY_TRACT | Status: AC
  Administered 2021-06-29: 2.5 mg via RESPIRATORY_TRACT

## 2021-06-29 MED ORDER — METHACHOLINE 16 MG/ML NEB SOLN
3.0000 mL | Freq: Once | RESPIRATORY_TRACT | Status: AC
  Administered 2021-06-29: 48 mg via RESPIRATORY_TRACT
  Filled 2021-06-29: qty 3

## 2021-06-29 MED ORDER — METHACHOLINE 4 MG/ML NEB SOLN
3.0000 mL | Freq: Once | RESPIRATORY_TRACT | Status: AC
  Administered 2021-06-29: 12 mg via RESPIRATORY_TRACT
  Filled 2021-06-29: qty 3

## 2021-07-05 ENCOUNTER — Telehealth: Payer: Self-pay | Admitting: Internal Medicine

## 2021-07-05 NOTE — Telephone Encounter (Signed)
Pt calling requesting to know the results of PFT which was performed 12/9. Dr. Shearon Stalls, please advise.

## 2021-07-06 NOTE — Telephone Encounter (Signed)
Patient is aware of results and voiced her understanding. She would like sooner OV.  Appt scheduled 07/27/2020 at 10:45. Nothing further needed at this time.

## 2021-07-06 NOTE — Telephone Encounter (Signed)
Breathing testing was negative for bronchial hyperresponsiveness, which means symptoms are not related to asthma. We can follow up about next steps in person - she can move her appointment up if symptoms are persistent or worsening.

## 2021-07-27 ENCOUNTER — Other Ambulatory Visit: Payer: Self-pay

## 2021-07-27 ENCOUNTER — Encounter: Payer: Self-pay | Admitting: Internal Medicine

## 2021-07-27 ENCOUNTER — Ambulatory Visit (INDEPENDENT_AMBULATORY_CARE_PROVIDER_SITE_OTHER): Payer: 59 | Admitting: Internal Medicine

## 2021-07-27 VITALS — BP 114/76 | HR 82 | Temp 98.5°F | Ht 63.5 in | Wt 141.8 lb

## 2021-07-27 DIAGNOSIS — R0602 Shortness of breath: Secondary | ICD-10-CM

## 2021-07-27 DIAGNOSIS — R053 Chronic cough: Secondary | ICD-10-CM | POA: Diagnosis not present

## 2021-07-27 MED ORDER — GABAPENTIN 100 MG PO CAPS: 300.0000 mg | ORAL_CAPSULE | Freq: Three times a day (TID) | ORAL | 5 refills | Status: AC

## 2021-07-27 NOTE — Patient Instructions (Addendum)
Please schedule follow up scheduled with myself in 3 months.  If my schedule is not open yet, we will contact you with a reminder closer to that time. Please call [phone] if you haven't heard from [place] a month before.   Before your next visit I would like you to have: Follow up with ENT doctor - we will call you to schedule this.  In the mean time start taking gabapentin for your cough.  Start at 100 mg at bedtime and over the next three days increase to 300 mg at bedtime. Once you reach this dose, you can then increase to 100 mg in the morning, 100 mg in the afternoon and 300 mg at night time. Increase your morning dose and then your afternoon dose every 2-3 days. Increase to a goal dosing of 300 mg three times a day or until your cough symptoms improve.   Common side effects: fatigue and nausea.   Keep taking flonase and cetirizine for now.

## 2021-07-27 NOTE — Progress Notes (Signed)
Manjit Bufano    [id number]    May 05, 1956  Primary Care Physician:Perini, Elta Guadeloupe, MD Date of Appointment: 07/27/2021 Established Patient Visit  Chief complaint:   Chief Complaint  Patient presents with   Follow-up    F/u after PFT.       HPI: Stacey Calhoun is a 66 y.o. woman who initially presented to see me in 2021 for persistent right-sided pain, and was found to have a persistent loculated pleural effusion felt to be inflammatory in nature after emergency surgery for appendicitis.Underwent chest tube drainage. This resolved.   She later had ongoing symptoms of dyspnea and chest tightness.  Interval Updates: Was seen by beth and started on symbicort inhaler and prn albuterol. She had a methacholine challenge test which was negative for bronchial hyperresponsiveness.   No improvement with symbicort or albuterol.  Symptoms are worse in the mornings, with cold air, sometimes positional when she rolls to one side of the bed. Symptoms do not wake her up at night. Last for half an hour, resolve with rest.   She does have constant dry cough which is not improved despite flonase and cetirizine.   She couldn't tell a difference with either inhaler. Made better with rest. Symptoms last for about half an hour. Do not wake her up at night.   Has had an endoscopy with GI - does not think this is related to GERD.   Not on ace and arb.   I have reviewed the patient's family social and past medical history and updated as appropriate.   Past Medical History:  Diagnosis Date   Diverticulitis    Fibroid    Hypertension    IBS (irritable bowel syndrome)    Interstitial cystitis    Menorrhagia    Migraines    Osteoporosis    on reclast   Thyroid nodule    u/s 2011 neg   Wears glasses     Past Surgical History:  Procedure Laterality Date   APPENDECTOMY     BRAVO [place] STUDY N/A 06/23/2020   Procedure: BRAVO [place] STUDY;  Surgeon: Carol Ada, MD;   Location: [facility];  Service: Endoscopy;  Laterality: N/A;   BREAST EXCISIONAL BIOPSY Right    BREAST EXCISIONAL BIOPSY Left    BREAST SURGERY  3664,4034   fibroadenoma in rt & left breast   CHOLECYSTECTOMY     ESOPHAGOGASTRODUODENOSCOPY (EGD) WITH PROPOFOL N/A 06/23/2020   Procedure: ESOPHAGOGASTRODUODENOSCOPY (EGD) WITH PROPOFOL;  Surgeon: Carol Ada, MD;  Location: [facility];  Service: Endoscopy;  Laterality: N/A;   TONSILLECTOMY     VAGINAL HYSTERECTOMY  2005    Family History  Problem Relation Age of Onset   Hypertension Mother    Osteoporosis Mother    Stroke Mother    Thyroid disease Mother    Cancer Father        bladder   Hypertension Father    Stroke Father    Hypertension Sister    Hypertension Brother    Other Brother        Factor 3   Diabetes Maternal Grandmother    Autoimmune disease Neg Hx     Social History   Occupational History   Not on file  Tobacco Use   Smoking status: Never   Smokeless tobacco: Never  Vaping Use   Vaping Use: Never used  Substance and Sexual Activity   Alcohol use: No   Drug use: No   Sexual activity: Yes  Partners: Male    Birth control/protection: Surgical    Comment: hysterectomy   Physical Exam: Blood pressure 114/76, pulse 82, temperature 98.5 F (36.9 C), temperature source Oral, height 5' 3.5" (1.613 m), weight 141 lb 12.8 oz (64.3 kg), last menstrual period 07/23/2003, SpO2 98 %.  Gen:      No acute distress Lungs:    ctab no wheezes or crackles, no increased work of breathing, normal voice CV:         RRR no mrg  Data Reviewed: Imaging: chest xray July 2021 no acute cardiopulmonary process  FeNO 13 ppb  Labs: Lab Results  Component Value Date   WBC 9.5 01/28/2020   HGB 14.1 01/28/2020   HCT 44.6 01/28/2020   MCV 94.1 01/28/2020   PLT 404 (H) 01/28/2020   Lab Results  Component Value Date   NA 138 01/28/2020   K 4.0 01/28/2020   CL 99 01/28/2020   CO2 28 01/28/2020     Immunization  status: Immunization History  Administered Date(s) Administered   Influenza Split 05/27/2011, 08/14/2011, 08/18/2012   Influenza, High Dose Seasonal PF 06/08/2014, 05/23/2015, 04/30/2019   Influenza,inj,Quad PF,6+ Mos 05/02/2018, 06/18/2021   PFIZER(Purple Top)SARS-COV-2 Vaccination 08/28/2019, 09/22/2019   Pneumococcal Polysaccharide-23 05/27/2011, 01/23/2021   Td 01/05/2020   Zoster Recombinat (Shingrix) 07/18/2017   Zoster, Live 05/27/2011    Assessment:  Shortness of breath Chronic cough   Plan/Recommendations: Methacholine challenge test was normal without bronchial hyperresponsiveness. Differential for chronic cough includes neurogenic, irritable larynx related. Will start treatment for neurogenic cough with gabapentin.   Asthma has been excluded for her dyspnea, she has already had cardiac evaluation. I'm unclear what is causing her shortness of breath at this time. Confirmatory testing for exercise testing is Eucapnic voluntary hyperventilation testing, which is usually only done at [facility]. Will consider if no improvement with cough treatment.   Will send her to ENT for vocal cord evaluation.   Return to Care: Return in about 3 months (around 10/25/2021).   Lenice Llamas, MD [facility] [place]

## 2021-08-24 ENCOUNTER — Ambulatory Visit: Payer: 59 | Admitting: Internal Medicine

## 2021-12-05 ENCOUNTER — Other Ambulatory Visit (HOSPITAL_BASED_OUTPATIENT_CLINIC_OR_DEPARTMENT_OTHER): Payer: Self-pay | Admitting: Internal Medicine

## 2021-12-05 DIAGNOSIS — Z1231 Encounter for screening mammogram for malignant neoplasm of breast: Secondary | ICD-10-CM

## 2021-12-14 ENCOUNTER — Ambulatory Visit (HOSPITAL_BASED_OUTPATIENT_CLINIC_OR_DEPARTMENT_OTHER)
Admission: RE | Admit: 2021-12-14 | Discharge: 2021-12-14 | Disposition: A | Discharge status: Home / Self Care | Payer: 59 | Source: Ambulatory Visit | Attending: Internal Medicine | Admitting: Internal Medicine

## 2021-12-14 DIAGNOSIS — Z1231 Encounter for screening mammogram for malignant neoplasm of breast: Secondary | ICD-10-CM

## 2022-02-22 DIAGNOSIS — R7989 Other specified abnormal findings of blood chemistry: Secondary | ICD-10-CM | POA: Diagnosis not present

## 2022-02-22 DIAGNOSIS — E559 Vitamin D deficiency, unspecified: Secondary | ICD-10-CM | POA: Diagnosis not present

## 2022-02-22 DIAGNOSIS — I10 Essential (primary) hypertension: Secondary | ICD-10-CM | POA: Diagnosis not present

## 2022-03-04 ENCOUNTER — Other Ambulatory Visit: Payer: Self-pay | Admitting: Internal Medicine

## 2022-03-04 DIAGNOSIS — R82998 Other abnormal findings in urine: Secondary | ICD-10-CM | POA: Diagnosis not present

## 2022-03-04 DIAGNOSIS — E785 Hyperlipidemia, unspecified: Secondary | ICD-10-CM

## 2022-05-02 DIAGNOSIS — D225 Melanocytic nevi of trunk: Secondary | ICD-10-CM | POA: Diagnosis not present

## 2022-05-02 DIAGNOSIS — Z85828 Personal history of other malignant neoplasm of skin: Secondary | ICD-10-CM | POA: Diagnosis not present

## 2022-05-02 DIAGNOSIS — Z08 Encounter for follow-up examination after completed treatment for malignant neoplasm: Secondary | ICD-10-CM | POA: Diagnosis not present

## 2022-05-02 DIAGNOSIS — L814 Other melanin hyperpigmentation: Secondary | ICD-10-CM | POA: Diagnosis not present

## 2022-05-02 DIAGNOSIS — L821 Other seborrheic keratosis: Secondary | ICD-10-CM | POA: Diagnosis not present

## 2022-05-23 ENCOUNTER — Ambulatory Visit
Admission: RE | Admit: 2022-05-23 | Discharge: 2022-05-23 | Disposition: A | Discharge status: Home / Self Care | Payer: No Typology Code available for payment source | Source: Ambulatory Visit | Attending: Internal Medicine | Admitting: Internal Medicine

## 2022-05-23 DIAGNOSIS — E785 Hyperlipidemia, unspecified: Secondary | ICD-10-CM

## 2022-05-24 ENCOUNTER — Other Ambulatory Visit: Payer: 59

## 2022-05-30 ENCOUNTER — Ambulatory Visit: Payer: 59 | Admitting: Physician Assistant

## 2022-06-26 DIAGNOSIS — J029 Acute pharyngitis, unspecified: Secondary | ICD-10-CM | POA: Diagnosis not present

## 2022-06-26 DIAGNOSIS — I10 Essential (primary) hypertension: Secondary | ICD-10-CM | POA: Diagnosis not present

## 2022-06-26 DIAGNOSIS — Z6824 Body mass index (BMI) 24.0-24.9, adult: Secondary | ICD-10-CM | POA: Diagnosis not present

## 2022-09-24 ENCOUNTER — Other Ambulatory Visit: Payer: Self-pay | Admitting: Adult Health

## 2022-09-24 DIAGNOSIS — R1030 Lower abdominal pain, unspecified: Secondary | ICD-10-CM

## 2022-09-24 DIAGNOSIS — Z8719 Personal history of other diseases of the digestive system: Secondary | ICD-10-CM

## 2022-09-25 ENCOUNTER — Ambulatory Visit
Admission: RE | Admit: 2022-09-25 | Discharge: 2022-09-25 | Disposition: A | Discharge status: Home / Self Care | Payer: 59 | Source: Ambulatory Visit | Attending: Adult Health | Admitting: Adult Health

## 2022-09-25 DIAGNOSIS — M4317 Spondylolisthesis, lumbosacral region: Secondary | ICD-10-CM | POA: Diagnosis not present

## 2022-09-25 DIAGNOSIS — I700 Atherosclerosis of aorta: Secondary | ICD-10-CM | POA: Diagnosis not present

## 2022-09-25 DIAGNOSIS — K429 Umbilical hernia without obstruction or gangrene: Secondary | ICD-10-CM | POA: Diagnosis not present

## 2022-09-25 DIAGNOSIS — R1030 Lower abdominal pain, unspecified: Secondary | ICD-10-CM

## 2022-09-25 DIAGNOSIS — K5730 Diverticulosis of large intestine without perforation or abscess without bleeding: Secondary | ICD-10-CM | POA: Diagnosis not present

## 2022-09-25 DIAGNOSIS — Z8719 Personal history of other diseases of the digestive system: Secondary | ICD-10-CM

## 2022-09-25 MED ORDER — IOPAMIDOL (ISOVUE-300) INJECTION 61%
100.0000 mL | Freq: Once | INTRAVENOUS | Status: AC | PRN
  Administered 2022-09-25: 100 mL via INTRAVENOUS

## 2022-09-30 ENCOUNTER — Other Ambulatory Visit: Payer: 59

## 2022-11-18 DIAGNOSIS — R300 Dysuria: Secondary | ICD-10-CM | POA: Diagnosis not present

## 2022-11-18 DIAGNOSIS — J069 Acute upper respiratory infection, unspecified: Secondary | ICD-10-CM | POA: Diagnosis not present

## 2022-11-18 DIAGNOSIS — I10 Essential (primary) hypertension: Secondary | ICD-10-CM | POA: Diagnosis not present

## 2022-11-18 DIAGNOSIS — R0981 Nasal congestion: Secondary | ICD-10-CM | POA: Diagnosis not present

## 2022-11-18 DIAGNOSIS — R051 Acute cough: Secondary | ICD-10-CM | POA: Diagnosis not present

## 2022-11-25 DIAGNOSIS — J309 Allergic rhinitis, unspecified: Secondary | ICD-10-CM | POA: Diagnosis not present

## 2022-11-25 DIAGNOSIS — R0982 Postnasal drip: Secondary | ICD-10-CM | POA: Diagnosis not present

## 2022-11-25 DIAGNOSIS — R051 Acute cough: Secondary | ICD-10-CM | POA: Diagnosis not present

## 2022-12-20 DIAGNOSIS — F4323 Adjustment disorder with mixed anxiety and depressed mood: Secondary | ICD-10-CM | POA: Diagnosis not present

## 2022-12-23 ENCOUNTER — Other Ambulatory Visit: Payer: Self-pay | Admitting: Internal Medicine

## 2022-12-23 DIAGNOSIS — Z1231 Encounter for screening mammogram for malignant neoplasm of breast: Secondary | ICD-10-CM

## 2023-01-15 ENCOUNTER — Ambulatory Visit
Admission: RE | Admit: 2023-01-15 | Discharge: 2023-01-15 | Disposition: A | Discharge status: Home / Self Care | Payer: 59 | Source: Ambulatory Visit | Attending: Internal Medicine | Admitting: Internal Medicine

## 2023-01-15 DIAGNOSIS — Z1231 Encounter for screening mammogram for malignant neoplasm of breast: Secondary | ICD-10-CM

## 2023-01-31 DIAGNOSIS — F4323 Adjustment disorder with mixed anxiety and depressed mood: Secondary | ICD-10-CM | POA: Diagnosis not present

## 2023-03-17 DIAGNOSIS — J309 Allergic rhinitis, unspecified: Secondary | ICD-10-CM | POA: Diagnosis not present

## 2023-03-17 DIAGNOSIS — I10 Essential (primary) hypertension: Secondary | ICD-10-CM | POA: Diagnosis not present

## 2023-03-17 DIAGNOSIS — R0981 Nasal congestion: Secondary | ICD-10-CM | POA: Diagnosis not present

## 2023-03-17 DIAGNOSIS — J0110 Acute frontal sinusitis, unspecified: Secondary | ICD-10-CM | POA: Diagnosis not present

## 2023-03-21 DIAGNOSIS — F4323 Adjustment disorder with mixed anxiety and depressed mood: Secondary | ICD-10-CM | POA: Diagnosis not present

## 2023-04-24 DIAGNOSIS — Z0000 Encounter for general adult medical examination without abnormal findings: Secondary | ICD-10-CM | POA: Diagnosis not present

## 2023-04-24 DIAGNOSIS — E559 Vitamin D deficiency, unspecified: Secondary | ICD-10-CM | POA: Diagnosis not present

## 2023-04-24 DIAGNOSIS — Z0189 Encounter for other specified special examinations: Secondary | ICD-10-CM | POA: Diagnosis not present

## 2023-04-24 DIAGNOSIS — E785 Hyperlipidemia, unspecified: Secondary | ICD-10-CM | POA: Diagnosis not present

## 2023-04-24 DIAGNOSIS — I10 Essential (primary) hypertension: Secondary | ICD-10-CM | POA: Diagnosis not present

## 2023-04-30 DIAGNOSIS — L814 Other melanin hyperpigmentation: Secondary | ICD-10-CM | POA: Diagnosis not present

## 2023-04-30 DIAGNOSIS — L821 Other seborrheic keratosis: Secondary | ICD-10-CM | POA: Diagnosis not present

## 2023-04-30 DIAGNOSIS — D225 Melanocytic nevi of trunk: Secondary | ICD-10-CM | POA: Diagnosis not present

## 2023-04-30 DIAGNOSIS — Z85828 Personal history of other malignant neoplasm of skin: Secondary | ICD-10-CM | POA: Diagnosis not present

## 2023-04-30 DIAGNOSIS — L820 Inflamed seborrheic keratosis: Secondary | ICD-10-CM | POA: Diagnosis not present

## 2023-04-30 DIAGNOSIS — D492 Neoplasm of unspecified behavior of bone, soft tissue, and skin: Secondary | ICD-10-CM | POA: Diagnosis not present

## 2023-04-30 DIAGNOSIS — L570 Actinic keratosis: Secondary | ICD-10-CM | POA: Diagnosis not present

## 2023-04-30 DIAGNOSIS — Z08 Encounter for follow-up examination after completed treatment for malignant neoplasm: Secondary | ICD-10-CM | POA: Diagnosis not present

## 2023-05-02 DIAGNOSIS — D682 Hereditary deficiency of other clotting factors: Secondary | ICD-10-CM | POA: Diagnosis not present

## 2023-05-02 DIAGNOSIS — Z832 Family history of diseases of the blood and blood-forming organs and certain disorders involving the immune mechanism: Secondary | ICD-10-CM | POA: Diagnosis not present

## 2023-05-02 DIAGNOSIS — M810 Age-related osteoporosis without current pathological fracture: Secondary | ICD-10-CM | POA: Diagnosis not present

## 2023-05-02 DIAGNOSIS — G459 Transient cerebral ischemic attack, unspecified: Secondary | ICD-10-CM | POA: Diagnosis not present

## 2023-05-02 DIAGNOSIS — Z0000 Encounter for general adult medical examination without abnormal findings: Secondary | ICD-10-CM | POA: Diagnosis not present

## 2023-05-02 DIAGNOSIS — I10 Essential (primary) hypertension: Secondary | ICD-10-CM | POA: Diagnosis not present

## 2023-05-02 DIAGNOSIS — Z23 Encounter for immunization: Secondary | ICD-10-CM | POA: Diagnosis not present

## 2023-05-02 DIAGNOSIS — R935 Abnormal findings on diagnostic imaging of other abdominal regions, including retroperitoneum: Secondary | ICD-10-CM | POA: Diagnosis not present

## 2023-05-02 DIAGNOSIS — R82998 Other abnormal findings in urine: Secondary | ICD-10-CM | POA: Diagnosis not present

## 2023-06-02 DIAGNOSIS — H52223 Regular astigmatism, bilateral: Secondary | ICD-10-CM | POA: Diagnosis not present

## 2023-06-02 DIAGNOSIS — H5202 Hypermetropia, left eye: Secondary | ICD-10-CM | POA: Diagnosis not present

## 2023-06-02 DIAGNOSIS — H524 Presbyopia: Secondary | ICD-10-CM | POA: Diagnosis not present

## 2023-06-20 DIAGNOSIS — H52223 Regular astigmatism, bilateral: Secondary | ICD-10-CM | POA: Diagnosis not present

## 2023-06-20 DIAGNOSIS — H524 Presbyopia: Secondary | ICD-10-CM | POA: Diagnosis not present

## 2023-07-02 DIAGNOSIS — L578 Other skin changes due to chronic exposure to nonionizing radiation: Secondary | ICD-10-CM | POA: Diagnosis not present

## 2023-09-05 DIAGNOSIS — I10 Essential (primary) hypertension: Secondary | ICD-10-CM | POA: Diagnosis not present

## 2023-09-05 DIAGNOSIS — G43009 Migraine without aura, not intractable, without status migrainosus: Secondary | ICD-10-CM | POA: Diagnosis not present

## 2023-09-17 DIAGNOSIS — H2513 Age-related nuclear cataract, bilateral: Secondary | ICD-10-CM | POA: Diagnosis not present

## 2023-09-17 DIAGNOSIS — H35033 Hypertensive retinopathy, bilateral: Secondary | ICD-10-CM | POA: Diagnosis not present

## 2023-09-23 DIAGNOSIS — H539 Unspecified visual disturbance: Secondary | ICD-10-CM | POA: Diagnosis not present

## 2023-09-23 DIAGNOSIS — I10 Essential (primary) hypertension: Secondary | ICD-10-CM | POA: Diagnosis not present

## 2023-09-26 ENCOUNTER — Other Ambulatory Visit: Payer: Self-pay | Admitting: Internal Medicine

## 2023-09-26 DIAGNOSIS — N6459 Other signs and symptoms in breast: Secondary | ICD-10-CM

## 2023-09-29 ENCOUNTER — Other Ambulatory Visit: Payer: Self-pay

## 2023-09-29 DIAGNOSIS — I6523 Occlusion and stenosis of bilateral carotid arteries: Secondary | ICD-10-CM

## 2023-10-03 ENCOUNTER — Other Ambulatory Visit: Payer: Self-pay | Admitting: Internal Medicine

## 2023-10-03 ENCOUNTER — Ambulatory Visit (HOSPITAL_COMMUNITY)
Admission: RE | Admit: 2023-10-03 | Discharge: 2023-10-03 | Disposition: A | Discharge status: Home / Self Care | Source: Ambulatory Visit | Attending: Vascular Surgery | Admitting: Vascular Surgery

## 2023-10-03 DIAGNOSIS — L299 Pruritus, unspecified: Secondary | ICD-10-CM

## 2023-10-03 DIAGNOSIS — N6459 Other signs and symptoms in breast: Secondary | ICD-10-CM

## 2023-10-03 DIAGNOSIS — I6523 Occlusion and stenosis of bilateral carotid arteries: Secondary | ICD-10-CM | POA: Diagnosis not present

## 2023-10-08 ENCOUNTER — Ambulatory Visit
Admission: RE | Admit: 2023-10-08 | Discharge: 2023-10-08 | Disposition: A | Discharge status: Home / Self Care | Source: Ambulatory Visit | Attending: Internal Medicine | Admitting: Internal Medicine

## 2023-10-08 DIAGNOSIS — L299 Pruritus, unspecified: Secondary | ICD-10-CM

## 2023-10-08 DIAGNOSIS — N6459 Other signs and symptoms in breast: Secondary | ICD-10-CM | POA: Diagnosis not present

## 2023-11-05 DIAGNOSIS — L578 Other skin changes due to chronic exposure to nonionizing radiation: Secondary | ICD-10-CM | POA: Diagnosis not present

## 2023-12-10 ENCOUNTER — Encounter: Payer: Self-pay | Admitting: Cardiology

## 2023-12-10 NOTE — Progress Notes (Signed)
 Cardiology Office Note   Date:  12/12/2023   ID:  Stacey, Calhoun 1956/01/16, MRN [id number]  PCP:  Stacey Hun, MD  Cardiologist:   Stacey Grates, MD   Chief Complaint  Patient presents with   Chest Pain      History of Present Illness: Stacey Calhoun is a 68 y.o. female who presents for who is referred by Stacey Hun, MD for evaluation of chest pain and SOB.    I last saw her in 2021.   She had left arm pain.  She had no coronary calcium .  Her discomfort was not thought to be anginal.  Another calcium  score was again zero in 2023.     She comes back again today because of an episode of blurred vision.  This happened a few months ago while she was sitting doing nothing.  She was watching TV and she noticed things getting blurred.  She did not have any motor or speech abnormalities.  She cannot feel her heart racing or skipping at that point.  She does occasionally now gets tachypalpitations.  She has also had some mid occasional chest discomfort that seems to be sporadic.  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  He needs a Nurse, adult.  She is not having any new shortness of breath, PND or orthopnea.  She did have a carotid Doppler which was normal after this event.  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)    HLD (hyperlipidemia)    Hypertension    IBS (irritable bowel syndrome)    Menorrhagia    Migraines    Osteoporosis    SVT (supraventricular tachycardia) (HCC)    Thyroid  nodule    u/s 2011 neg   TIA (transient ischemic attack)    Unspecified protein-calorie malnutrition (HCC)     Past Surgical History:  Procedure Laterality Date   APPENDECTOMY     BRAVO [place] STUDY  N/A 06/23/2020   Procedure: BRAVO [place] STUDY;  Surgeon: Alvis Jourdain, MD;  Location: [facility];  Service: Endoscopy;  Laterality: N/A;   BREAST EXCISIONAL BIOPSY Right    BREAST EXCISIONAL BIOPSY Left    BREAST SURGERY  [id number]   fibroadenoma in rt & left breast   CHOLECYSTECTOMY     ESOPHAGOGASTRODUODENOSCOPY (EGD) WITH PROPOFOL  N/A 06/23/2020   Procedure: ESOPHAGOGASTRODUODENOSCOPY (EGD) WITH PROPOFOL ;  Surgeon: Alvis Jourdain, MD;  Location: [facility];  Service: Endoscopy;  Laterality: N/A;   TONSILLECTOMY     VAGINAL HYSTERECTOMY  2005     Current Outpatient Medications  Medication Sig Dispense Refill   albuterol  (VENTOLIN  HFA) 108 (90 Base) MCG/ACT inhaler Inhale into the lungs every 6 (six) hours as needed for wheezing or shortness of breath.     aspirin  EC 81 MG tablet Take 81 mg by mouth daily. Swallow whole.     budesonide -formoterol  (SYMBICORT ) 80-4.5 MCG/ACT inhaler Inhale 2 puffs into the lungs in the morning and at bedtime. 1 each 3   cetirizine (ZYRTEC) 10 MG tablet Take 10 mg by mouth daily.     Cholecalciferol (VITAMIN D ) 125 MCG (5000 UT) CAPS Take 5,000 Units by mouth daily.  clonazePAM (KLONOPIN) 0.5 MG tablet Take 0.25 mg by mouth at bedtime as needed.     Dextran 70-Hypromellose (ARTIFICIAL TEARS PF OP) Place 1 drop into both eyes daily as needed (For dry eyes).      doxylamine , Sleep, (UNISOM ) 25 MG tablet Take 50 mg by mouth at bedtime.     FLUoxetine  (PROZAC ) 20 MG capsule Take 20 mg by mouth daily.     fluticasone  (FLONASE ) 50 MCG/ACT nasal spray Place 1 spray into both nostrils daily as needed for allergies or rhinitis.     gabapentin  (NEURONTIN ) 100 MG capsule Take 3 capsules (300 mg total) by mouth 3 (three) times daily. 120 capsule 5   Multiple Vitamins-Minerals (MULTIVITAMIN WITH MINERALS) tablet Take 1 tablet by mouth daily.     omeprazole (PRILOSEC OTC) 20 MG tablet Take 20 mg by mouth daily.     Probiotic Product (PROBIOTIC PO) Take 1 capsule by  mouth daily.      SUMAtriptan  (IMITREX ) 50 MG tablet Take 1 tablet (50 mg total) by mouth once. May repeat in 2 hours if headache persists or recurs. (Patient taking differently: Take 50 mg by mouth every 2 (two) hours as needed. May repeat in 2 hours if headache persists or recurs.) 18 tablet 2   telmisartan (MICARDIS) 20 MG tablet SMARTSIG:1 Tablet(s) By Mouth Every Evening     atorvastatin  (LIPITOR) 10 MG tablet Take 1 tablet (10 mg total) by mouth daily. 30 tablet 11   No current facility-administered medications for this visit.    Allergies:   Aspirin , Bupropion, Cefdinir, Escitalopram oxalate, Ibuprofen , Montelukast sodium, Sertraline hcl, Singulair [montelukast], and Sulfa antibiotics    Social History:  The patient  reports that she has never smoked. She has never used smokeless tobacco. She reports that she does not drink alcohol and does not use drugs.   Family History:  The patient's family history includes Atrial fibrillation in her brother and father; Breast cancer in her maternal aunt; Cancer in her father; Diabetes in her maternal grandmother; Hypertension in her brother, father, mother, and sister; Osteoporosis in her mother; Other in her brother; Stroke in her father and mother; Thyroid  disease in her mother.    ROS:  Please see the history of present illness.   Otherwise, review of systems are positive for none.   All other systems are reviewed and negative.    PHYSICAL EXAM: VS:  BP 136/72   Pulse 66   Ht 5' 3.5" (1.613 m)   Wt 145 lb (65.8 kg)   LMP 07/23/2003   SpO2 97%   BMI 25.28 kg/m  , BMI Body mass index is 25.28 kg/m. GENERAL:  Well appearing HEENT:  Pupils equal round and reactive, fundi not visualized, oral mucosa unremarkable NECK:  No jugular venous distention, waveform within normal limits, carotid upstroke brisk and symmetric, no bruits, no thyromegaly LYMPHATICS:  No cervical, inguinal adenopathy LUNGS:  Clear to auscultation bilaterally BACK:  No  CVA tenderness CHEST:  Unremarkable HEART:  PMI not displaced or sustained,S1 and S2 within normal limits, no S3, no S4, no clicks, no rubs, no murmurs ABD:  Flat, positive bowel sounds normal in frequency in pitch, no bruits, no rebound, no guarding, no midline pulsatile mass, no hepatomegaly, no splenomegaly EXT:  2 plus pulses throughout, no edema, no cyanosis no clubbing SKIN:  No rashes no nodules NEURO:  Cranial nerves II through XII grossly intact, motor grossly intact throughout PSYCH:  Cognitively intact, oriented to person place and time  EKG:  EKG Interpretation Date/Time:  Thursday Dec 11 2023 10:48:43 EDT Ventricular Rate:  67 PR Interval:  146 QRS Duration:  68 QT Interval:  380 QTC Calculation: 401 R Axis:   79  Text Interpretation: Normal sinus rhythm Normal ECG When compared with ECG of 28-Jan-2020 09:35, No significant change since last tracing Confirmed by Stacey Calhoun ([id number]) on 12/11/2023 11:03:46 AM     Recent Labs: No results found for requested labs within last 365 days.    Lipid Panel    Component Value Date/Time   CHOL 243 (H) 02/01/2020 1138   TRIG 142 02/01/2020 1138   HDL 82 02/01/2020 1138   CHOLHDL 3.0 02/01/2020 1138   CHOLHDL 1.9 06/06/2014 1200   VLDL 23 06/06/2014 1200   LDLCALC 136 (H) 02/01/2020 1138      Wt Readings from Last 3 Encounters:  12/11/23 145 lb (65.8 kg)  07/27/21 141 lb 12.8 oz (64.3 kg)  06/11/21 143 lb 9.6 oz (65.1 kg)      Other studies Reviewed: Additional studies/ records that were reviewed today include: Carotid, labs. Review of the above records demonstrates:  Please see elsewhere in the note.     ASSESSMENT AND PLAN: CHEST PAIN: The patient has precordial chest discomfort and a strong family history.  She has had a 0 calcium  score a few years ago.  I would like for her to do a POET (Plain Old Exercise Treadmill).  The pretest probability of obstructive coronary disease is somewhat low.    PALPITATIONS: The patient palpitations and an episode of blurred vision.  I am going to get a 4-week monitor.  Of note her labs when this event occurred including TSH and electrolytes were normal.  If this is normal then no further workup.   Current medicines are reviewed at length with the patient today.  The patient does not have concerns regarding medicines.  The following changes have been made:  no change  Labs/ tests ordered today include:   Orders Placed This Encounter  Procedures   Cardiac Stress Test: Informed Consent Details: Physician/Practitioner Attestation; Transcribe to consent form and obtain patient signature   EXERCISE TOLERANCE TEST (ETT)   CARDIAC EVENT MONITOR   EKG 12-Lead     Disposition:   FU with me as needed.     Signed, Stacey Grates, MD  12/12/2023 8:50 AM    [facility]

## 2023-12-11 ENCOUNTER — Ambulatory Visit: Attending: Cardiology

## 2023-12-11 ENCOUNTER — Ambulatory Visit: Attending: Cardiology | Admitting: Cardiology

## 2023-12-11 ENCOUNTER — Encounter: Payer: Self-pay | Admitting: Cardiology

## 2023-12-11 VITALS — BP 136/72 | HR 66 | Ht 63.5 in | Wt 145.0 lb

## 2023-12-11 DIAGNOSIS — R072 Precordial pain: Secondary | ICD-10-CM

## 2023-12-11 DIAGNOSIS — R002 Palpitations: Secondary | ICD-10-CM | POA: Diagnosis not present

## 2023-12-11 NOTE — Progress Notes (Unsigned)
 [employer] event monitor serial # [id number] from office inventory applied to patient.

## 2023-12-11 NOTE — Patient Instructions (Signed)
 Medication Instructions:  Your physician recommends that you continue on your current medications as directed. Please refer to the Current Medication list given to you today.  *If you need a refill on your cardiac medications before your next appointment, please call your pharmacy*  Testing/Procedures: Your physician has recommended that you wear an event monitor. Event monitors are medical devices that record the heart's electrical activity. Doctors most often us  these monitors to diagnose arrhythmias. Arrhythmias are problems with the speed or rhythm of the heartbeat. The monitor is a small, portable device. You can wear one while you do your normal daily activities. This is usually used to diagnose what is causing palpitations/syncope (passing out).  Your physician has requested that you have an exercise tolerance test. For further information please visit [email or web address] Please also follow instruction sheet, as given.   Follow-Up: At [facility], you and your health needs are our priority.  As part of our continuing mission to provide you with exceptional heart care, our providers are all part of one team.  This team includes your primary Cardiologist (physician) and Advanced Practice Providers or APPs (Physician Assistants and Nurse Practitioners) who all work together to provide you with the care you need, when you need it.  Your next appointment:   As needed  Provider:   Eilleen Grates, MD  We recommend signing up for the patient portal called "MyChart".  Sign up information is provided on this After Visit Summary.  MyChart is used to connect with patients for Virtual Visits (Telemedicine).  Patients are able to view lab/test results, encounter notes, upcoming appointments, etc.  Non-urgent messages can be sent to your provider as well.   To learn more about what you can do with MyChart, go to [email or web address].   Other Instructions Exercise Tolerance Test  Please  arrive 15 minutes prior to your appointment time for registration and insurance purposes.  The test will take approximately 45 minutes to complete.  How to prepare for your Exercise Stress Test: Do bring a list of your current medications with you.  If not listed below, you may take your medications as normal. Do wear comfortable clothes (no dresses or overalls) and walking shoes, tennis shoes preferred (no heels or open toed shoes are allowed) Do Not wear cologne, perfume, aftershave or lotions (deodorant is allowed). Please report to [address], [place], [place] [address] for your test.  If these instructions are not followed, your test will have to be rescheduled.  If you have questions or concerns about your appointment, you can call the Stress Lab at [phone].  If you cannot keep your appointment, please provide 24 hours notification to the Stress Lab, to avoid a possible $50 charge to your account.

## 2023-12-12 ENCOUNTER — Encounter: Payer: Self-pay | Admitting: Cardiology

## 2024-01-16 DIAGNOSIS — R002 Palpitations: Secondary | ICD-10-CM | POA: Diagnosis not present

## 2024-01-18 ENCOUNTER — Ambulatory Visit: Payer: Self-pay | Admitting: Cardiology

## 2024-02-03 ENCOUNTER — Telehealth (HOSPITAL_COMMUNITY): Payer: Self-pay | Admitting: *Deleted

## 2024-02-03 ENCOUNTER — Encounter (HOSPITAL_COMMUNITY): Payer: Self-pay | Admitting: *Deleted

## 2024-02-03 NOTE — Telephone Encounter (Signed)
 Letter with instructions for upcoming stress test sent via [employer].  Argentina Bees, RN

## 2024-02-04 DIAGNOSIS — R350 Frequency of micturition: Secondary | ICD-10-CM | POA: Diagnosis not present

## 2024-02-04 DIAGNOSIS — N390 Urinary tract infection, site not specified: Secondary | ICD-10-CM | POA: Diagnosis not present

## 2024-02-10 DIAGNOSIS — K080 Exfoliation of teeth due to systemic causes: Secondary | ICD-10-CM | POA: Diagnosis not present

## 2024-02-11 ENCOUNTER — Ambulatory Visit (HOSPITAL_COMMUNITY)
Admission: RE | Admit: 2024-02-11 | Discharge: 2024-02-11 | Disposition: A | Discharge status: Home / Self Care | Source: Ambulatory Visit | Attending: Internal Medicine | Admitting: Internal Medicine

## 2024-02-11 DIAGNOSIS — R072 Precordial pain: Secondary | ICD-10-CM | POA: Insufficient documentation

## 2024-02-11 LAB — EXERCISE TOLERANCE TEST
Angina Index: 0
Base ST Depression (mm): 0 mm
Duke Treadmill Score: 6
Estimated workload: 7.1
Exercise duration (min): 6 min
Exercise duration (sec): 7 s
MPHR: 155 {beats}/min
Peak HR: 153 {beats}/min
Percent HR: 98 %
Rest HR: 68 {beats}/min
ST Depression (mm): 0 mm

## 2024-02-13 ENCOUNTER — Other Ambulatory Visit: Payer: Self-pay | Admitting: Internal Medicine

## 2024-02-13 DIAGNOSIS — Z1231 Encounter for screening mammogram for malignant neoplasm of breast: Secondary | ICD-10-CM

## 2024-02-26 ENCOUNTER — Ambulatory Visit
Admission: RE | Admit: 2024-02-26 | Discharge: 2024-02-26 | Disposition: A | Discharge status: Home / Self Care | Source: Ambulatory Visit | Attending: Internal Medicine | Admitting: Internal Medicine

## 2024-02-26 DIAGNOSIS — Z1231 Encounter for screening mammogram for malignant neoplasm of breast: Secondary | ICD-10-CM | POA: Diagnosis not present

## 2024-03-01 ENCOUNTER — Other Ambulatory Visit: Payer: Self-pay

## 2024-03-01 ENCOUNTER — Emergency Department (INDEPENDENT_AMBULATORY_CARE_PROVIDER_SITE_OTHER)

## 2024-03-01 ENCOUNTER — Emergency Department (HOSPITAL_BASED_OUTPATIENT_CLINIC_OR_DEPARTMENT_OTHER)
Admission: EM | Admit: 2024-03-01 | Discharge: 2024-03-01 | Disposition: A | Discharge status: Home / Self Care | Attending: Emergency Medicine | Admitting: Emergency Medicine

## 2024-03-01 ENCOUNTER — Encounter (HOSPITAL_BASED_OUTPATIENT_CLINIC_OR_DEPARTMENT_OTHER): Payer: Self-pay

## 2024-03-01 DIAGNOSIS — Z7982 Long term (current) use of aspirin: Secondary | ICD-10-CM | POA: Diagnosis not present

## 2024-03-01 DIAGNOSIS — R197 Diarrhea, unspecified: Secondary | ICD-10-CM | POA: Diagnosis not present

## 2024-03-01 DIAGNOSIS — R7401 Elevation of levels of liver transaminase levels: Secondary | ICD-10-CM | POA: Insufficient documentation

## 2024-03-01 DIAGNOSIS — K5730 Diverticulosis of large intestine without perforation or abscess without bleeding: Secondary | ICD-10-CM

## 2024-03-01 DIAGNOSIS — R109 Unspecified abdominal pain: Secondary | ICD-10-CM

## 2024-03-01 DIAGNOSIS — R748 Abnormal levels of other serum enzymes: Secondary | ICD-10-CM | POA: Insufficient documentation

## 2024-03-01 DIAGNOSIS — R1032 Left lower quadrant pain: Secondary | ICD-10-CM | POA: Diagnosis not present

## 2024-03-01 LAB — CBC
HCT: 42.3 % (ref 36.0–46.0)
Hemoglobin: 13.9 g/dL (ref 12.0–15.0)
MCH: 29.6 pg (ref 26.0–34.0)
MCHC: 32.9 g/dL (ref 30.0–36.0)
MCV: 90.2 fL (ref 80.0–100.0)
Platelets: 282 K/uL (ref 150–400)
RBC: 4.69 MIL/uL (ref 3.87–5.11)
RDW: 14.2 % (ref 11.5–15.5)
WBC: 9.4 K/uL (ref 4.0–10.5)
nRBC: 0 % (ref 0.0–0.2)

## 2024-03-01 LAB — URINALYSIS, ROUTINE W REFLEX MICROSCOPIC
Bacteria, UA: NONE SEEN
Glucose, UA: NEGATIVE mg/dL
Hgb urine dipstick: NEGATIVE
Ketones, ur: 40 mg/dL — AB
Nitrite: NEGATIVE
Protein, ur: 30 mg/dL — AB
Specific Gravity, Urine: 1.02 (ref 1.005–1.030)
pH: 6.5 (ref 5.0–8.0)

## 2024-03-01 LAB — COMPREHENSIVE METABOLIC PANEL WITH GFR
ALT: 162 U/L — ABNORMAL HIGH (ref 0–44)
AST: 82 U/L — ABNORMAL HIGH (ref 15–41)
Albumin: 4.4 g/dL (ref 3.5–5.0)
Alkaline Phosphatase: 176 U/L — ABNORMAL HIGH (ref 38–126)
Anion gap: 16 — ABNORMAL HIGH (ref 5–15)
BUN: 12 mg/dL (ref 8–23)
CO2: 21 mmol/L — ABNORMAL LOW (ref 22–32)
Calcium: 9.9 mg/dL (ref 8.9–10.3)
Chloride: 98 mmol/L (ref 98–111)
Creatinine, Ser: 0.81 mg/dL (ref 0.44–1.00)
GFR, Estimated: >60 mL/min (ref >60)
Glucose, Bld: 103 mg/dL — ABNORMAL HIGH (ref 70–99)
Potassium: 3.4 mmol/L — ABNORMAL LOW (ref 3.5–5.1)
Sodium: 135 mmol/L (ref 135–145)
Total Bilirubin: 0.5 mg/dL (ref 0.0–1.2)
Total Protein: 7.4 g/dL (ref 6.5–8.1)

## 2024-03-01 LAB — LIPASE, BLOOD: Lipase: 19 U/L (ref 11–51)

## 2024-03-01 MED ORDER — IOHEXOL 300 MG/ML  SOLN
100.0000 mL | Freq: Once | INTRAMUSCULAR | Status: AC | PRN
  Administered 2024-03-01 (×2): 100 mL via INTRAVENOUS

## 2024-03-01 MED ORDER — MORPHINE SULFATE (PF) 4 MG/ML IV SOLN
4.0000 mg | Freq: Once | INTRAVENOUS | Status: AC
  Administered 2024-03-01 (×2): 4 mg via INTRAVENOUS
  Filled 2024-03-01: qty 1

## 2024-03-01 MED ORDER — OXYCODONE HCL 5 MG PO CAPS: 5.0000 mg | ORAL_CAPSULE | ORAL | 0 refills | Status: AC | PRN

## 2024-03-01 MED ORDER — SODIUM CHLORIDE 0.9 % IV BOLUS
1000.0000 mL | Freq: Once | INTRAVENOUS | Status: AC
  Administered 2024-03-01 (×2): 1000 mL via INTRAVENOUS

## 2024-03-01 MED ORDER — DIPHENHYDRAMINE HCL 50 MG/ML IJ SOLN
12.5000 mg | Freq: Once | INTRAMUSCULAR | Status: AC
  Administered 2024-03-01 (×2): 12.5 mg via INTRAVENOUS
  Filled 2024-03-01: qty 1

## 2024-03-01 MED ORDER — SODIUM CHLORIDE 0.9 % IV SOLN: INTRAVENOUS | Status: DC

## 2024-03-01 MED ORDER — ONDANSETRON 8 MG PO TBDP: 8.0000 mg | ORAL_TABLET | Freq: Three times a day (TID) | ORAL | 0 refills | Status: AC | PRN

## 2024-03-01 MED ORDER — METOCLOPRAMIDE HCL 5 MG/ML IJ SOLN
5.0000 mg | Freq: Once | INTRAMUSCULAR | Status: AC
  Administered 2024-03-01 (×2): 5 mg via INTRAVENOUS
  Filled 2024-03-01: qty 2

## 2024-03-01 MED ORDER — OXYCODONE-ACETAMINOPHEN 5-325 MG PO TABS
1.0000 | ORAL_TABLET | Freq: Four times a day (QID) | ORAL | 0 refills | Status: DC | PRN
Start: 2024-03-01 — End: 2024-03-01

## 2024-03-01 NOTE — Discharge Instructions (Addendum)
 Your liver functions test were elevated here.  Avoid Tylenol  as we discussed.  Return to the hospital for fever, vomiting, worsening pain.  Follow-up with the GI referral that I gave you

## 2024-03-01 NOTE — ED Triage Notes (Signed)
 Patient reports diarrhea and LLQ abdominal pain since Friday.

## 2024-03-01 NOTE — ED Provider Notes (Addendum)
 [place] EMERGENCY DEPARTMENT AT [facility] Provider Note   CSN: [id number] Arrival date & time: 03/01/24  9046     Patient presents with: Diarrhea   Stacey Calhoun is a 68 y.o. female.    68 year old female presents with several day history of left lower quadrant abdominal pain with watery diarrhea.  History of colitis in the past.  Denies any fever or chills.  No URI symptoms.  Patient states she took a home COVID test that was negative.  Diarrhea has persisted.  No recent antibiotics or travel history.  No blood in her stool.  Diarrhea characterized as brown loose stools.  Pain is dull and persistent and worse with ambulation.       Prior to Admission medications   Medication Sig Start Date End Date Taking? Authorizing Provider  albuterol  (VENTOLIN  HFA) 108 (90 Base) MCG/ACT inhaler Inhale into the lungs every 6 (six) hours as needed for wheezing or shortness of breath.    [provider]  aspirin  EC 81 MG tablet Take 81 mg by mouth daily. Swallow whole.    [provider]  atorvastatin  (LIPITOR) 10 MG tablet Take 1 tablet (10 mg total) by mouth daily. 04/12/20 04/12/21  Sethi, Pramod S, MD  budesonide -formoterol  (SYMBICORT ) 80-4.5 MCG/ACT inhaler Inhale 2 puffs into the lungs in the morning and at bedtime. 03/07/21   Desai, Nikita S, MD  cetirizine (ZYRTEC) 10 MG tablet Take 10 mg by mouth daily.    [provider]  Cholecalciferol (VITAMIN D ) 125 MCG (5000 UT) CAPS Take 5,000 Units by mouth daily.    [provider]  clonazePAM (KLONOPIN) 0.5 MG tablet Take 0.25 mg by mouth at bedtime as needed. 09/23/23   [provider]  Dextran 70-Hypromellose (ARTIFICIAL TEARS PF OP) Place 1 drop into both eyes daily as needed (For dry eyes).     [provider]  doxylamine , Sleep, (UNISOM ) 25 MG tablet Take 50 mg by mouth at bedtime.    [provider]  FLUoxetine  (PROZAC ) 20 MG capsule Take 20 mg by mouth daily.     [provider]  fluticasone  (FLONASE ) 50 MCG/ACT nasal spray Place 1 spray into both nostrils daily as needed for allergies or rhinitis.    [provider]  gabapentin  (NEURONTIN ) 100 MG capsule Take 3 capsules (300 mg total) by mouth 3 (three) times daily. 07/27/21   Desai, Nikita S, MD  Multiple Vitamins-Minerals (MULTIVITAMIN WITH MINERALS) tablet Take 1 tablet by mouth daily.    [provider]  omeprazole (PRILOSEC OTC) 20 MG tablet Take 20 mg by mouth daily.    [provider]  Probiotic Product (PROBIOTIC PO) Take 1 capsule by mouth daily.     [provider]  SUMAtriptan  (IMITREX ) 50 MG tablet Take 1 tablet (50 mg total) by mouth once. May repeat in 2 hours if headache persists or recurs. Patient taking differently: Take 50 mg by mouth every 2 (two) hours as needed. May repeat in 2 hours if headache persists or recurs. 08/01/15   Rodgers Barnie RAMAN, CNM  telmisartan (MICARDIS) 20 MG tablet SMARTSIG:1 Tablet(s) By Mouth Every Evening    [provider]    Allergies: Aspirin , Bupropion, Cefdinir, Escitalopram oxalate, Ibuprofen , Montelukast sodium, Sertraline hcl, Singulair [montelukast], and Sulfa antibiotics    Review of Systems  All other systems reviewed and are negative.   Updated Vital Signs BP 118/73   Pulse 96   Temp 97.8 F (36.6 C)   Resp 14  LMP 07/23/2003   SpO2 99%   Physical Exam Vitals and nursing note reviewed.  Constitutional:      General: She is not in acute distress.    Appearance: Normal appearance. She is well-developed. She is not toxic-appearing.  HENT:     Head: Normocephalic and atraumatic.  Eyes:     General: Lids are normal.     Conjunctiva/sclera: Conjunctivae normal.     Pupils: Pupils are equal, round, and reactive to light.  Neck:     Thyroid : No thyroid  mass.     Trachea: No tracheal deviation.  Cardiovascular:     Rate and Rhythm: Normal rate and regular rhythm.     Heart sounds:  Normal heart sounds. No murmur heard.    No gallop.  Pulmonary:     Effort: Pulmonary effort is normal. No respiratory distress.     Breath sounds: Normal breath sounds. No stridor. No decreased breath sounds, wheezing, rhonchi or rales.  Abdominal:     General: There is no distension.     Palpations: Abdomen is soft.     Tenderness: There is abdominal tenderness in the left lower quadrant. There is no guarding or rebound.   Musculoskeletal:        General: No tenderness. Normal range of motion.     Cervical back: Normal range of motion and neck supple.  Skin:    General: Skin is warm and dry.     Findings: No abrasion or rash.  Neurological:     Mental Status: She is alert and oriented to person, place, and time. Mental status is at baseline.     GCS: GCS eye subscore is 4. GCS verbal subscore is 5. GCS motor subscore is 6.     Cranial Nerves: No cranial nerve deficit.     Sensory: No sensory deficit.     Motor: Motor function is intact.  Psychiatric:        Attention and Perception: Attention normal.        Speech: Speech normal.        Behavior: Behavior normal.     (all labs ordered are listed, but only abnormal results are displayed) Labs Reviewed  LIPASE, BLOOD  COMPREHENSIVE METABOLIC PANEL WITH GFR  CBC  URINALYSIS, ROUTINE W REFLEX MICROSCOPIC    EKG: None  Radiology: No results found.   Procedures   Medications Ordered in the ED  0.9 %  sodium chloride  infusion ( Intravenous New Bag/Given 03/01/24 1020)  sodium chloride  0.9 % bolus 1,000 mL (has no administration in time range)                                    Medical Decision Making Amount and/or Complexity of Data Reviewed Labs: ordered. Radiology: ordered.  Risk Prescription drug management.    Patient given IV fluids and analgesics here.  Abdominal CT consistent with diverticulosis.  Labs show mild elevation of patient's transaminases.  Patient admits to taking Tylenol  daily for her leg  pain.  Suspect this is the etiology.  Patient informed to stop doing this.  Urinalysis does show some dehydration.  No evidence of infection.  No blood in the urine.  If patient referral to GI on-call per her request    Final diagnoses:  None    ED Discharge Orders     None          Dasie Faden, MD 03/01/24 1431  Dasie Faden, MD 03/01/24 [phone]

## 2024-03-02 DIAGNOSIS — R933 Abnormal findings on diagnostic imaging of other parts of digestive tract: Secondary | ICD-10-CM | POA: Diagnosis not present

## 2024-03-02 DIAGNOSIS — R1032 Left lower quadrant pain: Secondary | ICD-10-CM | POA: Diagnosis not present

## 2024-03-02 DIAGNOSIS — K5732 Diverticulitis of large intestine without perforation or abscess without bleeding: Secondary | ICD-10-CM | POA: Diagnosis not present

## 2024-03-02 DIAGNOSIS — R748 Abnormal levels of other serum enzymes: Secondary | ICD-10-CM | POA: Diagnosis not present

## 2024-04-01 ENCOUNTER — Telehealth: Payer: Self-pay | Admitting: *Deleted

## 2024-04-01 ENCOUNTER — Telehealth: Payer: Self-pay

## 2024-04-01 NOTE — Telephone Encounter (Signed)
   Name: Stacey Calhoun  DOB: December 31, 1955  MRN: [id number]  Primary Cardiologist: Lynwood Schilling, MD  Chart reviewed as part of pre-operative protocol coverage. Because of Stacey Calhoun's past medical history and time since last visit, she will require a follow-up telephone visit in order to better assess preoperative cardiovascular risk.  Pre-op covering staff: - Please schedule appointment and call patient to inform them. If patient already had an upcoming appointment within acceptable timeframe, please add pre-op clearance to the appointment notes so provider is aware. - Please contact requesting surgeon's office via preferred method (i.e, phone, fax) to inform them of need for appointment prior to surgery.  No medications need held.  Of note: POET ordered and neg. 01/2024  Orren LOISE Fabry, PA-C  04/01/2024, 3:37 PM

## 2024-04-01 NOTE — Telephone Encounter (Signed)
 Med Rec and Consent done     Patient Consent for Virtual Visit        Stacey Calhoun has provided verbal consent on 04/01/2024 for a virtual visit (video or telephone).   CONSENT FOR VIRTUAL VISIT FOR:  Stacey Calhoun  By participating in this virtual visit I agree to the following:  I hereby voluntarily request, consent and authorize [facility] and its employed or contracted physicians, physician assistants, nurse practitioners or other licensed health care professionals (the Practitioner), to provide me with telemedicine health care services (the "Services) as deemed necessary by the treating Practitioner. I acknowledge and consent to receive the Services by the Practitioner via telemedicine. I understand that the telemedicine visit will involve communicating with the Practitioner through live audiovisual communication technology and the disclosure of certain medical information by electronic transmission. I acknowledge that I have been given the opportunity to request an in-person assessment or other available alternative prior to the telemedicine visit and am voluntarily participating in the telemedicine visit.  I understand that I have the right to withhold or withdraw my consent to the use of telemedicine in the course of my care at any time, without affecting my right to future care or treatment, and that the Practitioner or I may terminate the telemedicine visit at any time. I understand that I have the right to inspect all information obtained and/or recorded in the course of the telemedicine visit and may receive copies of available information for a reasonable fee.  I understand that some of the potential risks of receiving the Services via telemedicine include:  Delay or interruption in medical evaluation due to technological equipment failure or disruption; Information transmitted may not be sufficient (e.g. poor resolution of images) to allow for  appropriate medical decision making by the Practitioner; and/or  In rare instances, security protocols could fail, causing a breach of personal health information.  Furthermore, I acknowledge that it is my responsibility to provide information about my medical history, conditions and care that is complete and accurate to the best of my ability. I acknowledge that Practitioner's advice, recommendations, and/or decision may be based on factors not within their control, such as incomplete or inaccurate data provided by me or distortions of diagnostic images or specimens that may result from electronic transmissions. I understand that the practice of medicine is not an exact science and that Practitioner makes no warranties or guarantees regarding treatment outcomes. I acknowledge that a copy of this consent can be made available to me via my patient portal [facility] MyChart), or I can request a printed copy by calling the office of [facility].    I understand that my insurance will be billed for this visit.   I have read or had this consent read to me. I understand the contents of this consent, which adequately explains the benefits and risks of the Services being provided via telemedicine.  I have been provided ample opportunity to ask questions regarding this consent and the Services and have had my questions answered to my satisfaction. I give my informed consent for the services to be provided through the use of telemedicine in my medical care

## 2024-04-01 NOTE — Telephone Encounter (Signed)
   Pre-operative Risk Assessment    Patient Name: Stacey Calhoun  DOB: 09/07/1955 MRN: [id number]   Date of last office visit: 12/11/23 DR. HOCHREIN Date of next office visit: NONE   Request for Surgical Clearance    Procedure:  COLONOSCOPY  Date of Surgery:  Clearance 04/14/24                                Surgeon:  DR. MANN  Surgeon's Group or Practice Name:  [facility]  Phone number:  [phone] Fax number:  [phone]   Type of Clearance Requested:   - Medical ; NONE TO BE HELD PER FORM   Type of Anesthesia:  PROPOFOL    Additional requests/questions:    Signed, Hikaru Delorenzo   04/01/2024, 2:19 PM

## 2024-04-01 NOTE — Telephone Encounter (Signed)
 S/W pt and scheduled TELE Preop appt for 04/07/24.  Med Rec and Consent done   Will update the surgeons office.

## 2024-04-07 ENCOUNTER — Ambulatory Visit: Attending: Cardiology

## 2024-04-07 DIAGNOSIS — Z01810 Encounter for preprocedural cardiovascular examination: Secondary | ICD-10-CM

## 2024-04-07 NOTE — Progress Notes (Signed)
 Virtual Visit via Telephone Note   Because of [name] co-morbid illnesses, she is at least at moderate risk for complications without adequate follow up.  This format is felt to be most appropriate for this patient at this time.  Due to technical limitations with video connection (technology), today's appointment will be conducted as an audio only telehealth visit, and Stacey Calhoun verbally agreed to proceed in this manner.   All issues noted in this document were discussed and addressed.  No physical exam could be performed with this format.  Evaluation Performed:  Preoperative cardiovascular risk assessment _____________   Date:  04/07/2024   Patient ID:  Stacey Calhoun, DOB 1956/07/22, MRN [id number] Patient Location:  Home Provider location:   Office  Primary Care Provider:  Shayne Anes, MD Primary Cardiologist:  Lynwood Schilling, MD  Chief Complaint / Patient Profile  68 y.o. y/o female with a h/o migraines, IBS, diverticulitis, chest pain who is pending colonoscopy with Dr. Kristie on in 04/14/2024 and presents today for telephonic preoperative cardiovascular risk assessment. History of Present Illness  Stacey Calhoun is a 68 y.o. female who presents via audio/video conferencing for a telehealth visit today.  Pt was last seen in cardiology clinic on 12/11/2023 by Dr. Schilling.  At that time Stacey Calhoun was doing well, patient reported an episode of blurred vision that had happened a few months prior.  She also noted some mid occasional chest discomfort that was sporadic, patient underwent exercise treadmill test on 02/11/2024 that was a low risk exercise treadmill test with no electrocardiographic evidence of ischemia.  Patient also wore a reassuring cardiac monitor.  The patient is now pending procedure as outlined above. Since her last visit, she has remained stable from a cardiac standpoint. Today she denies chest pain, shortness of  breath, lower extremity edema, fatigue, palpitations, melena, hematuria, hemoptysis, diaphoresis, weakness, presyncope, syncope, orthopnea, and PND.  She is able to achieve greater than 4 METS of activity. 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)    HLD (hyperlipidemia)    Hypertension    IBS (irritable bowel syndrome)    Menorrhagia    Migraines    Osteoporosis    SVT (supraventricular tachycardia) (HCC)    Thyroid  nodule    u/s 2011 neg   TIA (transient ischemic attack)    Unspecified protein-calorie malnutrition (HCC)    Past Surgical History:  Procedure Laterality Date   APPENDECTOMY     BRAVO PH STUDY N/A 06/23/2020   Procedure: BRAVO PH STUDY;  Surgeon: Rollin Dover, MD;  Location: [facility];  Service: Endoscopy;  Laterality: N/A;   BREAST EXCISIONAL BIOPSY Right    BREAST EXCISIONAL BIOPSY Left    BREAST SURGERY  8020,8004   fibroadenoma in rt & left breast   CHOLECYSTECTOMY     ESOPHAGOGASTRODUODENOSCOPY (EGD) WITH PROPOFOL  N/A 06/23/2020   Procedure: ESOPHAGOGASTRODUODENOSCOPY (EGD) WITH PROPOFOL ;  Surgeon: Rollin Dover, MD;  Location: [facility];  Service: Endoscopy;  Laterality: N/A;   TONSILLECTOMY     VAGINAL HYSTERECTOMY  2005    Allergies  Allergies  Allergen Reactions   Aspirin  Other (See Comments)   Bupropion Other (See Comments)   Cefdinir Other (  See Comments)   Escitalopram Oxalate Other (See Comments)   Ibuprofen  Other (See Comments)   Montelukast Sodium Other (See Comments)   Sertraline Hcl Other (See Comments)   Singulair [Montelukast] Other (See Comments)    Headaches, irritability    Sulfa Antibiotics Rash and Other (See Comments)    Home Medications    Prior to Admission medications    Medication Sig Start Date End Date Taking? Authorizing Provider  albuterol  (VENTOLIN  HFA) 108 (90 Base) MCG/ACT inhaler Inhale into the lungs every 6 (six) hours as needed for wheezing or shortness of breath.    [provider]  aspirin  EC 81 MG tablet Take 81 mg by mouth daily. Swallow whole.    [provider]  atorvastatin  (LIPITOR) 10 MG tablet Take 1 tablet (10 mg total) by mouth daily. 04/12/20   Rosemarie Eather RAMAN, MD  budesonide -formoterol  (SYMBICORT ) 80-4.5 MCG/ACT inhaler Inhale 2 puffs into the lungs in the morning and at bedtime. 03/07/21   Desai, Nikita S, MD  cetirizine (ZYRTEC) 10 MG tablet Take 10 mg by mouth daily.    [provider]  Cholecalciferol (VITAMIN D ) 125 MCG (5000 UT) CAPS Take 5,000 Units by mouth daily.    [provider]  clonazePAM (KLONOPIN) 0.5 MG tablet Take 0.25 mg by mouth at bedtime as needed. 09/23/23   [provider]  Dextran 70-Hypromellose (ARTIFICIAL TEARS PF OP) Place 1 drop into both eyes daily as needed (For dry eyes).     [provider]  doxylamine , Sleep, (UNISOM ) 25 MG tablet Take 50 mg by mouth at bedtime.    [provider]  FLUoxetine  (PROZAC ) 20 MG capsule Take 20 mg by mouth daily.    [provider]  fluticasone  (FLONASE ) 50 MCG/ACT nasal spray Place 1 spray into both nostrils daily as needed for allergies or rhinitis.    [provider]  gabapentin  (NEURONTIN ) 100 MG capsule Take 3 capsules (300 mg total) by mouth 3 (three) times daily. 07/27/21   Desai, Nikita S, MD  Multiple Vitamins-Minerals (MULTIVITAMIN WITH MINERALS) tablet Take 1 tablet by mouth daily.    [provider]  omeprazole (PRILOSEC OTC) 20 MG tablet Take 20 mg by mouth daily.    [provider]  ondansetron  (ZOFRAN -ODT) 8 MG disintegrating tablet Take 1 tablet (8 mg total) by mouth every 8 (eight) hours as needed for nausea or vomiting. 03/01/24   Dasie Faden, MD  oxycodone  (OXY-IR)  5 MG capsule Take 1 capsule (5 mg total) by mouth every 4 (four) hours as needed. 03/01/24   Dasie Faden, MD  Probiotic Product (PROBIOTIC PO) Take 1 capsule by mouth daily.     [provider]  SUMAtriptan  (IMITREX ) 50 MG tablet Take 1 tablet (50 mg total) by mouth once. May repeat in 2 hours if headache persists or recurs. Patient taking differently: Take 50 mg by mouth every 2 (two) hours as needed. May repeat in 2 hours if headache persists or recurs. 08/01/15   Rodgers Stacey RAMAN, CNM  telmisartan (MICARDIS) 20 MG tablet SMARTSIG:1 Tablet(s) By Mouth Every Evening    [provider]    Physical Exam  Vital Signs:  Stacey Calhoun does not have vital signs available for review today. Given telephonic nature of communication, physical exam is limited. AAOx3. NAD. Normal affect.  Speech and respirations are unlabored. Accessory Clinical Findings  None Assessment & Plan    1.  Preoperative Cardiovascular Risk Assessment: Ms. Clegg's perioperative risk of a major  cardiac event is 0.4% according to the Revised Cardiac Risk Index (RCRI).  Therefore, she is at low risk for perioperative complications.   Her functional capacity is good at 7.44 METs according to the [place] Activity Status Index (DASI). Recommendations: According to ACC/AHA guidelines, no further cardiovascular testing needed.  The patient may proceed to surgery at acceptable risk.   Antiplatelet and/or Anticoagulation Recommendations: None requested  The patient was advised that if she develops new symptoms prior to surgery to contact our office to arrange for a follow-up visit, and she verbalized understanding.  A copy of this note will be routed to requesting surgeon.  Time:   Today, I have spent 10 minutes with the patient with telehealth technology discussing medical history, symptoms, and management plan.    Deshanna Kama D Cornell Bourbon, NP  04/07/2024, 4:07 PM

## 2024-04-14 DIAGNOSIS — K317 Polyp of stomach and duodenum: Secondary | ICD-10-CM | POA: Diagnosis not present

## 2024-04-14 DIAGNOSIS — Z1211 Encounter for screening for malignant neoplasm of colon: Secondary | ICD-10-CM | POA: Diagnosis not present

## 2024-04-14 DIAGNOSIS — D509 Iron deficiency anemia, unspecified: Secondary | ICD-10-CM | POA: Diagnosis not present

## 2024-04-14 DIAGNOSIS — K5730 Diverticulosis of large intestine without perforation or abscess without bleeding: Secondary | ICD-10-CM | POA: Diagnosis not present

## 2024-04-14 DIAGNOSIS — K635 Polyp of colon: Secondary | ICD-10-CM | POA: Diagnosis not present

## 2024-04-29 DIAGNOSIS — R1032 Left lower quadrant pain: Secondary | ICD-10-CM | POA: Diagnosis not present

## 2024-04-29 DIAGNOSIS — K219 Gastro-esophageal reflux disease without esophagitis: Secondary | ICD-10-CM | POA: Diagnosis not present

## 2024-04-29 DIAGNOSIS — K5732 Diverticulitis of large intestine without perforation or abscess without bleeding: Secondary | ICD-10-CM | POA: Diagnosis not present

## 2024-06-04 DIAGNOSIS — H5202 Hypermetropia, left eye: Secondary | ICD-10-CM | POA: Diagnosis not present
